# Patient Record
Sex: Female | Born: 1935 | Race: White | Hispanic: No | Marital: Married | Smoking: Former smoker
Health system: Southern US, Community
[De-identification: ages and names within clinical notes are randomized; demographics above are authoritative.]

## PROBLEM LIST (undated history)

## (undated) DIAGNOSIS — I10 Essential (primary) hypertension: Secondary | ICD-10-CM

## (undated) DIAGNOSIS — R319 Hematuria, unspecified: Secondary | ICD-10-CM

## (undated) DIAGNOSIS — M1990 Unspecified osteoarthritis, unspecified site: Secondary | ICD-10-CM

## (undated) DIAGNOSIS — M8580 Other specified disorders of bone density and structure, unspecified site: Secondary | ICD-10-CM

## (undated) DIAGNOSIS — I2510 Atherosclerotic heart disease of native coronary artery without angina pectoris: Secondary | ICD-10-CM

## (undated) DIAGNOSIS — I219 Acute myocardial infarction, unspecified: Secondary | ICD-10-CM

## (undated) DIAGNOSIS — E785 Hyperlipidemia, unspecified: Secondary | ICD-10-CM

## (undated) DIAGNOSIS — K219 Gastro-esophageal reflux disease without esophagitis: Secondary | ICD-10-CM

## (undated) HISTORY — DX: Essential (primary) hypertension: I10

## (undated) HISTORY — DX: Gastro-esophageal reflux disease without esophagitis: K21.9

## (undated) HISTORY — PX: NEPHROLITHOTOMY: SUR881

## (undated) HISTORY — DX: Unspecified osteoarthritis, unspecified site: M19.90

## (undated) HISTORY — PX: OTHER SURGICAL HISTORY: SHX169

## (undated) HISTORY — PX: CATARACT EXTRACTION W/ INTRAOCULAR LENS  IMPLANT, BILATERAL: SHX1307

## (undated) HISTORY — DX: Acute myocardial infarction, unspecified: I21.9

## (undated) HISTORY — DX: Hyperlipidemia, unspecified: E78.5

## (undated) HISTORY — DX: Atherosclerotic heart disease of native coronary artery without angina pectoris: I25.10

## (undated) HISTORY — PX: DILATION AND CURETTAGE OF UTERUS: SHX78

## (undated) HISTORY — DX: Hematuria, unspecified: R31.9

## (undated) HISTORY — DX: Other specified disorders of bone density and structure, unspecified site: M85.80

---

## 1998-09-17 ENCOUNTER — Other Ambulatory Visit: Admission: RE | Admit: 1998-09-17 | Discharge: 1998-09-17 | Payer: Self-pay | Admitting: Gynecology

## 1999-09-19 ENCOUNTER — Other Ambulatory Visit: Admission: RE | Admit: 1999-09-19 | Discharge: 1999-09-19 | Payer: Self-pay | Admitting: Obstetrics and Gynecology

## 2001-03-24 ENCOUNTER — Other Ambulatory Visit: Admission: RE | Admit: 2001-03-24 | Discharge: 2001-03-24 | Payer: Self-pay | Admitting: Obstetrics and Gynecology

## 2001-04-29 ENCOUNTER — Encounter: Payer: Self-pay | Admitting: Family Medicine

## 2001-04-29 ENCOUNTER — Encounter: Admission: RE | Admit: 2001-04-29 | Discharge: 2001-04-29 | Payer: Self-pay | Admitting: Family Medicine

## 2001-05-06 ENCOUNTER — Other Ambulatory Visit: Admission: RE | Admit: 2001-05-06 | Discharge: 2001-05-06 | Payer: Self-pay | Admitting: Family Medicine

## 2002-01-07 ENCOUNTER — Encounter: Admission: RE | Admit: 2002-01-07 | Discharge: 2002-01-07 | Payer: Self-pay | Admitting: Family Medicine

## 2002-01-07 ENCOUNTER — Encounter: Payer: Self-pay | Admitting: Family Medicine

## 2002-03-24 ENCOUNTER — Other Ambulatory Visit: Admission: RE | Admit: 2002-03-24 | Discharge: 2002-03-24 | Payer: Self-pay | Admitting: Obstetrics and Gynecology

## 2002-03-29 ENCOUNTER — Encounter: Payer: Self-pay | Admitting: Obstetrics and Gynecology

## 2002-03-29 ENCOUNTER — Encounter: Admission: RE | Admit: 2002-03-29 | Discharge: 2002-03-29 | Payer: Self-pay | Admitting: Obstetrics and Gynecology

## 2003-03-27 ENCOUNTER — Other Ambulatory Visit: Admission: RE | Admit: 2003-03-27 | Discharge: 2003-03-27 | Payer: Self-pay | Admitting: Obstetrics and Gynecology

## 2004-02-12 ENCOUNTER — Ambulatory Visit (HOSPITAL_COMMUNITY): Admission: RE | Admit: 2004-02-12 | Discharge: 2004-02-12 | Payer: Self-pay | Admitting: Gastroenterology

## 2004-02-12 ENCOUNTER — Encounter (INDEPENDENT_AMBULATORY_CARE_PROVIDER_SITE_OTHER): Payer: Self-pay | Admitting: Specialist

## 2004-04-01 ENCOUNTER — Other Ambulatory Visit: Admission: RE | Admit: 2004-04-01 | Discharge: 2004-04-01 | Payer: Self-pay | Admitting: Obstetrics and Gynecology

## 2005-04-01 ENCOUNTER — Other Ambulatory Visit: Admission: RE | Admit: 2005-04-01 | Discharge: 2005-04-01 | Payer: Self-pay | Admitting: Obstetrics and Gynecology

## 2005-11-04 ENCOUNTER — Encounter: Admission: RE | Admit: 2005-11-04 | Discharge: 2005-11-04 | Payer: Self-pay | Admitting: Family Medicine

## 2006-04-09 ENCOUNTER — Other Ambulatory Visit: Admission: RE | Admit: 2006-04-09 | Discharge: 2006-04-09 | Payer: Self-pay | Admitting: Obstetrics and Gynecology

## 2006-05-26 ENCOUNTER — Encounter: Admission: RE | Admit: 2006-05-26 | Discharge: 2006-05-26 | Payer: Self-pay | Admitting: Family Medicine

## 2006-06-30 ENCOUNTER — Ambulatory Visit (HOSPITAL_BASED_OUTPATIENT_CLINIC_OR_DEPARTMENT_OTHER): Admission: RE | Admit: 2006-06-30 | Discharge: 2006-06-30 | Payer: Self-pay | Admitting: Urology

## 2006-07-02 ENCOUNTER — Ambulatory Visit (HOSPITAL_COMMUNITY): Admission: RE | Admit: 2006-07-02 | Discharge: 2006-07-02 | Payer: Self-pay | Admitting: Urology

## 2007-04-13 ENCOUNTER — Other Ambulatory Visit: Admission: RE | Admit: 2007-04-13 | Discharge: 2007-04-13 | Payer: Self-pay | Admitting: Obstetrics and Gynecology

## 2009-05-30 ENCOUNTER — Other Ambulatory Visit: Admission: RE | Admit: 2009-05-30 | Discharge: 2009-05-30 | Payer: Self-pay | Admitting: Obstetrics and Gynecology

## 2010-06-18 NOTE — Op Note (Signed)
NAME:  Jacqueline Flores, Jacqueline Flores             ACCOUNT NO.:  [id number]   MEDICAL RECORD NO.:  [id number]          PATIENT TYPE:  AMB   LOCATION:  DAY                          FACILITY:  [facility]   PHYSICIAN:  Boston Service, M.D.DATE OF BIRTH:  [birth year]   DATE OF PROCEDURE:  06/30/2006  DATE OF DISCHARGE:                               OPERATIVE REPORT   PREOPERATIVE DIAGNOSIS:  Right ureteropelvic junction stones, 10 mm and  6 mm.   POSTOPERATIVE DIAGNOSIS:  Right ureteropelvic junction stones, 10 mm and  6 mm.   PROCEDURES:  1. Cystoscopy.  2. Retrograde double-J stent placement, 6-French 24 cm.   SURGEON:  Boston Service, M.D.   ASSISTANT:  None.   ANESTHESIA:  General.   SPECIMENS:  None.   ESTIMATED BLOOD LOSS:  Minimal.   COMPLICATIONS:  None obvious.   INDICATIONS:  A 75 year old female with right flank pain with hematuria;  CT and cystoscopy consistent with that diagnosis.  There are 10 mm and 6  mm stones on the right.  The patient had smaller nonobstructive stones  on the left, as seen on CT.  Plans made for stent placement in  anticipation of ESWL.   DESCRIPTION OF PROCEDURE:  The patient was prepped and draped in the  dorsal lithotomy position, after institution of an adequate level of  general anesthesia.  A well-lubricated 21-French panendoscope was gently  inserted at the urethral meatus.  There were normal urethra and  sphincter, normal trigone and orifices.  Mild atrophic changes.   Retrograde catheter was selected, positioned at the right and left  ureteral orifice.  With gentle injection of contrast, the ureter, pelvis  and calyces were outlined on the left.  Tiny stones appeared to be  nonobstructive.  On the right side, the patient appeared to have a 10 mm  stone perched above the UPJ, and a 6 mm stone within the lower pole.   A floppy-tip guidewire was inserted, passed beyond the UPJ stone into  what appeared to be dilated upper pole calyces.  A  6-French 24 cm double-  J stent was selected, passed over the indwelling guidewire -- with  excellent pigtail formation on guidewire removal; both within the renal  pelvis and within the bladder.  Bladder was drained.  Cystoscope was  removed.  The patient was returned to recovery in satisfactory  condition.           ______________________________  Boston Service, M.D.     RH/MEDQ  D:  06/30/2006  T:  06/30/2006  Job:  [id number]   cc:   Donia Guiles, M.D.  Fax: [phone]

## 2010-06-21 NOTE — Op Note (Signed)
NAME:  Jacqueline Flores, Jacqueline Flores             ACCOUNT NO.:  [id number]   MEDICAL RECORD NO.:  [id number]          PATIENT TYPE:  AMB   LOCATION:  ENDO                         FACILITY:  [facility]   PHYSICIAN:  James L. Malon Kindle., M.D.DATE OF BIRTH:  Sep 23, 1935   DATE OF PROCEDURE:  02/12/2004  DATE OF DISCHARGE:                                 OPERATIVE REPORT   PROCEDURE:  Colonoscopy with biopsy.   SURGEON:   MEDICATIONS:  Fentanyl 60 mcg and Versed 7 mg IV.   INDICATIONS FOR PROCEDURE:  Colon cancer screening.   DESCRIPTION OF PROCEDURE:  The procedure had been explained to the patient  and consent obtained.  In the left lateral decubitus position, the Olympus  pediatric adjustable colonoscope was inserted and advanced.  Prep was quite  good using and some abdominal pressure and position changes we were able to  reach the cecum.  There was an AVM in the cecum.  It was not active with  bleeding.  It was photographed.  The ileocecal valve and appendiceal orifice  were seen.  The scope was withdrawn and in the mid ascending colon, a 3 mm  sessile polyp was encountered. This was biopsied with cold biopsy forceps.  No other polyps were seen throughout the ascending, transverse or sigmoid  colon.  There was no significant diverticular disease.  Scope was withdrawn.  The patient tolerated the procedure well.   ASSESSMENT:  1.  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  2.  Ascending colon polyp biopsied, 211.3.   PLAN:  Will check pathology, routine post colonoscopy instructions.       JLE/MEDQ  D:  02/12/2004  T:  02/12/2004  Job:  [id number]   cc:   Donia Guiles, M.D.  [address] [place]  [place] [address]  Fax: [phone]

## 2010-11-29 DIAGNOSIS — H02839 Dermatochalasis of unspecified eye, unspecified eyelid: Secondary | ICD-10-CM | POA: Insufficient documentation

## 2011-07-16 ENCOUNTER — Other Ambulatory Visit (HOSPITAL_COMMUNITY)
Admission: RE | Admit: 2011-07-16 | Discharge: 2011-07-16 | Disposition: A | Discharge status: Home / Self Care | Payer: Medicare Other | Source: Ambulatory Visit | Attending: Family Medicine | Admitting: Family Medicine

## 2011-07-16 ENCOUNTER — Other Ambulatory Visit: Payer: Self-pay | Admitting: Family Medicine

## 2011-07-16 DIAGNOSIS — Z124 Encounter for screening for malignant neoplasm of cervix: Secondary | ICD-10-CM | POA: Insufficient documentation

## 2011-08-18 ENCOUNTER — Ambulatory Visit
Admission: RE | Admit: 2011-08-18 | Discharge: 2011-08-18 | Disposition: A | Discharge status: Home / Self Care | Payer: Medicare Other | Source: Ambulatory Visit | Attending: Family Medicine | Admitting: Family Medicine

## 2011-08-18 ENCOUNTER — Other Ambulatory Visit: Payer: Self-pay | Admitting: Family Medicine

## 2011-08-18 DIAGNOSIS — R937 Abnormal findings on diagnostic imaging of other parts of musculoskeletal system: Secondary | ICD-10-CM

## 2011-08-29 DIAGNOSIS — H00019 Hordeolum externum unspecified eye, unspecified eyelid: Secondary | ICD-10-CM | POA: Insufficient documentation

## 2011-12-01 DIAGNOSIS — Z961 Presence of intraocular lens: Secondary | ICD-10-CM | POA: Insufficient documentation

## 2012-04-23 ENCOUNTER — Other Ambulatory Visit: Payer: Self-pay | Admitting: Dermatology

## 2013-01-17 DIAGNOSIS — H0230 Blepharochalasis unspecified eye, unspecified eyelid: Secondary | ICD-10-CM | POA: Insufficient documentation

## 2013-03-10 ENCOUNTER — Encounter: Payer: Self-pay | Admitting: Cardiology

## 2013-03-10 DIAGNOSIS — K219 Gastro-esophageal reflux disease without esophagitis: Secondary | ICD-10-CM | POA: Insufficient documentation

## 2013-03-10 DIAGNOSIS — M899 Disorder of bone, unspecified: Secondary | ICD-10-CM | POA: Insufficient documentation

## 2013-03-10 DIAGNOSIS — E7800 Pure hypercholesterolemia, unspecified: Secondary | ICD-10-CM | POA: Insufficient documentation

## 2013-03-10 DIAGNOSIS — I2510 Atherosclerotic heart disease of native coronary artery without angina pectoris: Secondary | ICD-10-CM | POA: Insufficient documentation

## 2013-03-10 DIAGNOSIS — M949 Disorder of cartilage, unspecified: Secondary | ICD-10-CM

## 2013-03-10 DIAGNOSIS — I119 Hypertensive heart disease without heart failure: Secondary | ICD-10-CM | POA: Insufficient documentation

## 2013-03-10 DIAGNOSIS — I252 Old myocardial infarction: Secondary | ICD-10-CM | POA: Insufficient documentation

## 2013-10-21 ENCOUNTER — Encounter: Payer: Self-pay | Admitting: Neurology

## 2013-10-27 ENCOUNTER — Ambulatory Visit (INDEPENDENT_AMBULATORY_CARE_PROVIDER_SITE_OTHER): Payer: Medicare Other | Admitting: Neurology

## 2013-10-27 ENCOUNTER — Encounter: Payer: Self-pay | Admitting: Neurology

## 2013-10-27 VITALS — BP 118/76 | HR 65 | Ht 63.0 in | Wt 152.0 lb

## 2013-10-27 DIAGNOSIS — G20 Parkinson's disease: Secondary | ICD-10-CM

## 2013-10-27 DIAGNOSIS — R251 Tremor, unspecified: Secondary | ICD-10-CM

## 2013-10-27 DIAGNOSIS — G609 Hereditary and idiopathic neuropathy, unspecified: Secondary | ICD-10-CM

## 2013-10-27 DIAGNOSIS — R259 Unspecified abnormal involuntary movements: Secondary | ICD-10-CM

## 2013-10-27 LAB — CBC WITH DIFFERENTIAL/PLATELET
BASOS ABS: 0.1 10*3/uL (ref 0.0–0.1)
BASOS PCT: 1 % (ref 0–1)
EOS ABS: 0.2 10*3/uL (ref 0.0–0.7)
EOS PCT: 3 % (ref 0–5)
HEMATOCRIT: 44 % (ref 36.0–46.0)
Hemoglobin: 15 g/dL (ref 12.0–15.0)
Lymphocytes Relative: 32 % (ref 12–46)
Lymphs Abs: 2.4 10*3/uL (ref 0.7–4.0)
MCH: 32.2 pg (ref 26.0–34.0)
MCHC: 34.1 g/dL (ref 30.0–36.0)
MCV: 94.4 fL (ref 78.0–100.0)
MONO ABS: 0.8 10*3/uL (ref 0.1–1.0)
MONOS PCT: 11 % (ref 3–12)
NEUTROS ABS: 4 10*3/uL (ref 1.7–7.7)
Neutrophils Relative %: 53 % (ref 43–77)
Platelets: 245 10*3/uL (ref 150–400)
RBC: 4.66 MIL/uL (ref 3.87–5.11)
RDW: 13.9 % (ref 11.5–15.5)
WBC: 7.6 10*3/uL (ref 4.0–10.5)

## 2013-10-27 LAB — RPR

## 2013-10-27 LAB — TSH: TSH: 2.976 u[IU]/mL (ref 0.350–4.500)

## 2013-10-27 LAB — FOLATE: FOLATE: 17.3 ng/mL

## 2013-10-27 LAB — VITAMIN B12: Vitamin B-12: 199 pg/mL — ABNORMAL LOW (ref 211–911)

## 2013-10-27 NOTE — Progress Notes (Signed)
Subjective:   Jacqueline Flores was seen in consultation in the movement disorder clinic at the request of Dr. Laurann Montana.  Pts PCP is [name],[name], MD.  The evaluation is for tremor.  The patient is a 78 y.o. right handed female with a history of tremor.  Tremor just started in the R hand after a fall from a truck 3 weeks ago.  She was getting into her truck and literally fell out attempting to get in.  She fell on the L side of the body and L hip.  Did not hit the head, just the shoulder and ribs and buttocks;  A few days later, she noted tremor of the R hand (husband actually noted it first when it was at rest).  Not noted it when using it, but notes it when ambulating.    Tremor details: Affected by caffeine:  No.  (2-3 medium sized cups per day) Affected by alcohol: unknown Affected by stress:  No. Affected by fatigue:  Yes.   Spills soup if on spoon:  No. Spills glass of liquid if full:  No. Affects ADL's (tying shoes, brushing teeth, etc):  No.  Other sx's:  Voice: no changes Sleep: sleeps well, but cramps in legs  Vivid Dreams:  Yes.    Acting out dreams:  No. Wet Pillows: Yes.   Postural symptoms:  Yes.    Falls?  No. Bradykinesia symptoms: slow movements and difficulty getting out of a chair Loss of smell:  Yes.   Loss of taste:  No. Urinary Incontinence:  No. Difficulty Swallowing:  No. Handwriting, micrographia: No. But admits to "sloppy" Trouble with ADL's:  No.  Trouble buttoning clothing: Yes.  (has husband do this) Depression:  No. (nothing significant) Memory changes:  Mild loss (pay bills without trouble, drives and cooks without trouble, takes on meds without trouble) Hallucinations:  No.  visual distortions: Yes.   N/V:  No. Lightheaded:  Yes.    Syncope: No. Diplopia:  No.  Neuroimaging has never been performed   Outside reports reviewed: historical medical records, lab reports and office notes.  Allergies  Allergen Reactions  . Zetia  [Ezetimibe]     Swelling   . Sulfa Antibiotics Rash    Outpatient Encounter Prescriptions as of 10/27/2013  Medication Sig  . aspirin 81 MG tablet Take 81 mg by mouth daily.  . Calcium Carbonate (CALCIUM 600) 1500 MG TABS Take by mouth.  . hydrochlorothiazide (HYDRODIURIL) 25 MG tablet Take 25 mg by mouth as needed.  . moexipril (UNIVASC) 7.5 MG tablet Take 7.5 mg by mouth at bedtime.  Marland Kitchen NIFEdipine (PROCARDIA-XL/ADALAT CC) 30 MG 24 hr tablet Take 30 mg by mouth daily.  . Omega-3 Fatty Acids (FISH OIL) 1000 MG CAPS Take 1 capsule by mouth 3 (three) times daily.  Marland Kitchen omeprazole (PRILOSEC) 40 MG capsule Take 40 mg by mouth daily.  . pravastatin (PRAVACHOL) 80 MG tablet Take 80 mg by mouth daily.    Past Medical History  Diagnosis Date  . Coronary artery disease     post MI  . MI (myocardial infarction)     age 89  . Hyperlipidemia   . Hypertension   . Esophageal reflux   . Osteoarthritis   . Hematuria   . Osteopenia     Past Surgical History  Procedure Laterality Date  . Nephrolithotomy    . Dilation and curettage of uterus    . Cataract extraction w/ intraocular lens  implant, bilateral    . Lid  lift      bilaterally, lid lag    History   Social History  . Marital Status: Married    Spouse Name: N/A    Number of Children: N/A  . Years of Education: N/A   Occupational History  .      [employer]   Social History Main Topics  . Smoking status: Former Smoker -- 30 years    Quit date: 10/28/2010  . Smokeless tobacco: Not on file  . Alcohol Use: Yes     Comment: glass of wine once a month  . Drug Use: No  . Sexual Activity: Not on file   Other Topics Concern  . Not on file   Social History Narrative  . No narrative on file    Family Status  Relation Status Death Age  . Mother Deceased     Breast Cancer  . Father Deceased 46    MI, CAD  . Sister Alive     healthy  . Maternal Grandmother Deceased     DM  . Daughter Alive     healthy  .  Daughter Alive     healthy    Review of Systems A complete 10 system ROS was obtained and was negative apart from what is mentioned.   Objective:   VITALS:   Filed Vitals:   10/27/13 0828  BP: 118/76  Pulse: 65  Height: 5\' 3"  (1.6 m)  Weight: 152 lb (68.947 kg)   Gen:  Appears stated age and in NAD. HEENT:  Normocephalic, atraumatic. The mucous membranes are moist. The superficial temporal arteries are without ropiness or tenderness. Cardiovascular: Regular rate and rhythm. Lungs: Clear to auscultation bilaterally. Neck: There are no carotid bruits noted bilaterally.  NEUROLOGICAL:  Orientation:  The patient is alert and oriented x 3.  Recent and remote memory are intact.  Attention span and concentration are normal.  Able to name objects and repeat without trouble.  Fund of knowledge is appropriate.  However, is apraxic when asked to perform commands. Cranial nerves: There is good facial symmetry. The pupils are equal round and reactive to light bilaterally. Fundoscopic exam reveals clear disc margins bilaterally. Extraocular muscles are intact and visual fields are full to confrontational testing. Speech is fluent and clear. Soft palate rises symmetrically and there is no tongue deviation. Hearing is intact to conversational tone. Tone: Tone is good throughout.  No rigidity. Sensation: Sensation is intact to light touch and pinprick throughout (facial, trunk, extremities). Vibration is decreased markedly in distal fashion. There is no extinction with double simultaneous stimulation. There is no sensory dermatomal level identified. Coordination:  The patient has no decremation with rapid alternating movements and no dysmetria.  However, she does not perform rapid alternating movements all that well, but it appears to be more apraxia related. Motor: Strength is 5/5 in the bilateral upper and lower extremities.  Shoulder shrug is equal bilaterally.  There is no pronator drift.  There are  no fasciculations noted. DTR's: Deep tendon reflexes are 2/4 at the bilateral biceps, triceps, brachioradialis, patella and trace at the bilateral achilles.  Plantar responses are downgoing bilaterally. Gait and Station: The patient is able to ambulate without difficulty. There is good arm swing but there is right upper extremity tremor with ambulation  MOVEMENT EXAM: Tremor:  There is no postural tremor.  There is no rest tremor until I have her count backwards and name the months backwards and then a very mild/minimal rest tremor is noted.  Assessment/Plan:   1.  Tremor.  -There is just a very mild rest tremor that increases with ambulation, but she otherwise does not meet criteria for Parkinson's disease.  She is, however, somewhat apraxic with motor commands but she has no rigidity and very minimal bradykinesia.  -I am going to do an MRI of the brain, as she reports that this was relatively acute in onset after a fall.  However, she did not hit her head in the fall.  I explained to her that I absolutely expect to see small vessel disease as she has a history of hypertension, hyperlipidemia, coronary artery disease and tobacco abuse (and still smokes cigarette "now and then").  Talked about d/c all together.    -Talked about DaT scan.  Decided against it.  2.  PN, on examination  -Do labs to r/o reversible causes 3.  Decided to f/u in 4-6 months but explained that I absolutely want to see sooner if new neuro issues arise.

## 2013-10-27 NOTE — Patient Instructions (Signed)
1. Your provider has requested that you have labwork completed today. Please go to [facility] on the first floor of this building before leaving the office today. 2. We have scheduled you at [facility] for your MRI on 11/09/2013 at 8:00 am. Please arrive 15 minutes prior and go to 1st floor radiology. If you need to reschedule for any reason please call [phone].

## 2013-10-31 LAB — UIFE/LIGHT CHAINS/TP QN, 24-HR UR
ALBUMIN, U: DETECTED
ALPHA 1 UR: DETECTED — AB
Alpha 2, Urine: DETECTED — AB
Beta, Urine: DETECTED — AB
GAMMA UR: DETECTED — AB
TOTAL PROTEIN, URINE-UPE24: 12 mg/dL (ref 5–24)

## 2013-10-31 LAB — SPEP & IFE WITH QIG
ALBUMIN ELP: 57.6 % (ref 55.8–66.1)
ALPHA-1-GLOBULIN: 4.2 % (ref 2.9–4.9)
Alpha-2-Globulin: 11.7 % (ref 7.1–11.8)
Beta 2: 6 % (ref 3.2–6.5)
Beta Globulin: 6.4 % (ref 4.7–7.2)
Gamma Globulin: 14.1 % (ref 11.1–18.8)
IGA: 273 mg/dL (ref 69–380)
IGM, SERUM: 186 mg/dL (ref 52–322)
IgG (Immunoglobin G), Serum: 947 mg/dL (ref 690–1700)
TOTAL PROTEIN, SERUM ELECTROPHOR: 7.7 g/dL (ref 6.0–8.3)

## 2013-11-09 ENCOUNTER — Ambulatory Visit (HOSPITAL_COMMUNITY)
Admission: RE | Admit: 2013-11-09 | Discharge: 2013-11-09 | Disposition: A | Discharge status: Home / Self Care | Payer: Medicare Other | Source: Ambulatory Visit | Attending: Neurology | Admitting: Neurology

## 2013-11-09 DIAGNOSIS — Z720 Tobacco use: Secondary | ICD-10-CM | POA: Insufficient documentation

## 2013-11-09 DIAGNOSIS — R251 Tremor, unspecified: Secondary | ICD-10-CM | POA: Diagnosis present

## 2013-11-09 DIAGNOSIS — G319 Degenerative disease of nervous system, unspecified: Secondary | ICD-10-CM | POA: Diagnosis not present

## 2013-11-09 DIAGNOSIS — E785 Hyperlipidemia, unspecified: Secondary | ICD-10-CM | POA: Insufficient documentation

## 2013-11-09 DIAGNOSIS — E119 Type 2 diabetes mellitus without complications: Secondary | ICD-10-CM | POA: Insufficient documentation

## 2013-11-09 DIAGNOSIS — I10 Essential (primary) hypertension: Secondary | ICD-10-CM | POA: Diagnosis present

## 2013-11-09 DIAGNOSIS — G20 Parkinson's disease: Secondary | ICD-10-CM

## 2013-11-09 LAB — CREATININE, SERUM
Creatinine, Ser: 0.77 mg/dL (ref 0.50–1.10)
GFR, EST NON AFRICAN AMERICAN: 80 mL/min — AB (ref >90)

## 2013-11-09 MED ORDER — GADOBENATE DIMEGLUMINE 529 MG/ML IV SOLN
15.0000 mL | Freq: Once | INTRAVENOUS | Status: AC
  Administered 2013-11-09: 15 mL via INTRAVENOUS

## 2013-11-10 ENCOUNTER — Telehealth: Payer: Self-pay | Admitting: Neurology

## 2013-11-10 NOTE — Telephone Encounter (Signed)
Message copied by Annamaria Helling on Thu Nov 10, 2013  9:05 AM ------      Message from: TAT, [place] S      Created: Wed Nov 09, 2013  5:07 PM       Reviewed.  Very severe/significant small vessel disease and moderate/severe atrophy (cerebral more than cerebellar).  Jade, tell patient that MRI didn't show anything new or acute and nothing unexpected (these are the things that I told her that I thought we would see).  Brain has shrunk with age. ------

## 2013-11-10 NOTE — Telephone Encounter (Signed)
Tried to call patient with no answer and no option to leave voicemail. Will try again later.

## 2013-11-10 NOTE — Telephone Encounter (Signed)
Left message on machine for patient to call back.

## 2013-11-10 NOTE — Telephone Encounter (Signed)
Patient called back and made aware of results.

## 2013-11-11 ENCOUNTER — Telehealth: Payer: Self-pay | Admitting: Neurology

## 2013-11-11 NOTE — Telephone Encounter (Signed)
B12 injection scheduled for Tuesday.   Dr Tat - do these need to be monthly or start as weekly? Please advise.

## 2013-11-11 NOTE — Telephone Encounter (Signed)
Message copied by Annamaria Helling on Fri Nov 11, 2013 10:11 AM ------      Message from: TAT, [place]: Mon Oct 31, 2013  3:38 PM       Danae Chen, please call patient and let her know B12 low.  Recommend injections.  She can get here or at PCP office, whichever is easiest. ------

## 2013-11-14 NOTE — Telephone Encounter (Signed)
Given her low level, recommend weekly for a month and then q month

## 2013-11-15 ENCOUNTER — Ambulatory Visit (INDEPENDENT_AMBULATORY_CARE_PROVIDER_SITE_OTHER): Payer: Medicare Other | Admitting: Neurology

## 2013-11-15 DIAGNOSIS — E538 Deficiency of other specified B group vitamins: Secondary | ICD-10-CM

## 2013-11-15 MED ORDER — CYANOCOBALAMIN 1000 MCG/ML IJ SOLN
1000.0000 ug | Freq: Once | INTRAMUSCULAR | Status: AC
  Administered 2013-11-15: 1000 ug via INTRAMUSCULAR

## 2013-11-15 NOTE — Progress Notes (Signed)
B12 injection to left deltoid with no apparent complications.   

## 2013-11-16 ENCOUNTER — Telehealth: Payer: Self-pay | Admitting: Neurology

## 2013-11-16 NOTE — Telephone Encounter (Signed)
request for records recv'd by fax from [facility]. Request forwarded to HIM @ [facility] via courier for processing / Sherri

## 2013-11-22 ENCOUNTER — Ambulatory Visit (INDEPENDENT_AMBULATORY_CARE_PROVIDER_SITE_OTHER): Payer: Medicare Other | Admitting: Neurology

## 2013-11-22 DIAGNOSIS — E538 Deficiency of other specified B group vitamins: Secondary | ICD-10-CM

## 2013-11-22 MED ORDER — CYANOCOBALAMIN 1000 MCG/ML IJ SOLN
1000.0000 ug | Freq: Once | INTRAMUSCULAR | Status: AC
  Administered 2013-11-22: 1000 ug via INTRAMUSCULAR

## 2013-11-22 NOTE — Progress Notes (Signed)
B12 injection to right deltoid with no apparent complications.   

## 2013-11-23 DIAGNOSIS — H04123 Dry eye syndrome of bilateral lacrimal glands: Secondary | ICD-10-CM | POA: Insufficient documentation

## 2013-11-30 ENCOUNTER — Ambulatory Visit (INDEPENDENT_AMBULATORY_CARE_PROVIDER_SITE_OTHER): Payer: Medicare Other | Admitting: Neurology

## 2013-11-30 DIAGNOSIS — E538 Deficiency of other specified B group vitamins: Secondary | ICD-10-CM

## 2013-11-30 MED ORDER — CYANOCOBALAMIN 1000 MCG/ML IJ SOLN
1000.0000 ug | Freq: Once | INTRAMUSCULAR | Status: AC
  Administered 2013-11-30: 1000 ug via INTRAMUSCULAR

## 2013-11-30 NOTE — Progress Notes (Signed)
B12 injection to right deltoid with no apparent complications.   

## 2013-12-07 ENCOUNTER — Ambulatory Visit (INDEPENDENT_AMBULATORY_CARE_PROVIDER_SITE_OTHER): Payer: Medicare Other | Admitting: Neurology

## 2013-12-07 DIAGNOSIS — E538 Deficiency of other specified B group vitamins: Secondary | ICD-10-CM

## 2013-12-07 MED ORDER — CYANOCOBALAMIN 1000 MCG/ML IJ SOLN
1000.0000 ug | Freq: Once | INTRAMUSCULAR | Status: AC
  Administered 2013-12-07: 1000 ug via INTRAMUSCULAR

## 2013-12-07 NOTE — Progress Notes (Signed)
B12 injection to left deltoid with no apparent complications.   

## 2014-01-04 ENCOUNTER — Ambulatory Visit (INDEPENDENT_AMBULATORY_CARE_PROVIDER_SITE_OTHER): Payer: Medicare Other | Admitting: Neurology

## 2014-01-04 DIAGNOSIS — E538 Deficiency of other specified B group vitamins: Secondary | ICD-10-CM

## 2014-01-04 MED ORDER — CYANOCOBALAMIN 1000 MCG/ML IJ SOLN
1000.0000 ug | Freq: Once | INTRAMUSCULAR | Status: AC
  Administered 2014-01-04: 1000 ug via INTRAMUSCULAR

## 2014-01-04 NOTE — Progress Notes (Signed)
B12 injection to left deltoid with no apparent complications.   

## 2014-02-07 ENCOUNTER — Ambulatory Visit (INDEPENDENT_AMBULATORY_CARE_PROVIDER_SITE_OTHER): Payer: Medicare Other | Admitting: Neurology

## 2014-02-07 DIAGNOSIS — E538 Deficiency of other specified B group vitamins: Secondary | ICD-10-CM

## 2014-02-07 MED ORDER — CYANOCOBALAMIN 1000 MCG/ML IJ SOLN
1000.0000 ug | Freq: Once | INTRAMUSCULAR | Status: AC
  Administered 2014-02-07: 1000 ug via INTRAMUSCULAR

## 2014-02-07 NOTE — Progress Notes (Signed)
B12 injection to right deltoid with no apparent complications.   

## 2014-03-02 ENCOUNTER — Telehealth: Payer: Self-pay | Admitting: Neurology

## 2014-03-02 NOTE — Telephone Encounter (Signed)
Medical records request received from [place] at [facility] and forwarded to medical records.

## 2014-03-10 ENCOUNTER — Ambulatory Visit (INDEPENDENT_AMBULATORY_CARE_PROVIDER_SITE_OTHER): Payer: Medicare Other | Admitting: *Deleted

## 2014-03-10 DIAGNOSIS — E538 Deficiency of other specified B group vitamins: Secondary | ICD-10-CM

## 2014-03-10 MED ORDER — CYANOCOBALAMIN 1000 MCG/ML IJ SOLN
1000.0000 ug | Freq: Once | INTRAMUSCULAR | Status: AC
Start: 2014-03-10 — End: 2014-03-10
  Administered 2014-03-10: 1000 ug via INTRAMUSCULAR

## 2014-03-10 NOTE — Progress Notes (Signed)
Patient in for B12 injection. 

## 2014-04-04 ENCOUNTER — Ambulatory Visit (INDEPENDENT_AMBULATORY_CARE_PROVIDER_SITE_OTHER): Payer: Medicare Other | Admitting: Neurology

## 2014-04-04 ENCOUNTER — Encounter: Payer: Self-pay | Admitting: Neurology

## 2014-04-04 VITALS — BP 90/58 | HR 76 | Ht 63.0 in | Wt 160.0 lb

## 2014-04-04 DIAGNOSIS — E538 Deficiency of other specified B group vitamins: Secondary | ICD-10-CM

## 2014-04-04 DIAGNOSIS — G609 Hereditary and idiopathic neuropathy, unspecified: Secondary | ICD-10-CM

## 2014-04-04 DIAGNOSIS — I6782 Cerebral ischemia: Secondary | ICD-10-CM

## 2014-04-04 DIAGNOSIS — F419 Anxiety disorder, unspecified: Secondary | ICD-10-CM

## 2014-04-04 MED ORDER — CYANOCOBALAMIN 1000 MCG/ML IJ SOLN
1000.0000 ug | Freq: Once | INTRAMUSCULAR | Status: AC
  Administered 2014-04-04: 1000 ug via INTRAMUSCULAR

## 2014-04-04 NOTE — Progress Notes (Signed)
B12 injection to left deltoid with no apparent complications.   

## 2014-04-04 NOTE — Progress Notes (Signed)
Subjective:   Jacqueline Flores was seen in consultation in the movement disorder clinic at the request of Dr. Laurann Montana.  Pts PCP is [name],[name], MD.  The evaluation is for tremor.  The patient is a 79 y.o. right handed female with a history of tremor.  Tremor just started in the R hand after a fall from a truck 3 weeks ago.  She was getting into her truck and literally fell out attempting to get in.  She fell on the L side of the body and L hip.  Did not hit the head, just the shoulder and ribs and buttocks;  A few days later, she noted tremor of the R hand (husband actually noted it first when it was at rest).  Not noted it when using it, but notes it when ambulating.    Tremor details: Affected by caffeine:  No.  (2-3 medium sized cups per day) Affected by alcohol: unknown Affected by stress:  No. Affected by fatigue:  Yes.   Spills soup if on spoon:  No. Spills glass of liquid if full:  No. Affects ADL's (tying shoes, brushing teeth, etc):  No.  04/04/14 update:  The patient is following up today, earlier than expected.  She is accompanied by her daughter who supplements the history.  She thinks that tremor has increased on the right.  She did have an MRI of the brain since last visit that revealed  atrophy and relatively severe small vessel disease.  She is already on ASA.  She had lab workup since last visit for reversible causes of peripheral neuropathy.  Her B12 was quite low at 199.  She has been started on injections and has been receiving them faithfully.  Her RPR was negative, TSH was normal, and there was no M spike in the serum protein electrophoresis.  There is no monoclonal proteins and urine protein electrophoresis.    She is c/o dry eye and has been to the eye doctor.  She is using drops and a gel in her eyes.  No diplopia.  She has had no falls since last visit, but she has "stumbled some."  Daughter states that she hears the patient "shuffle more."  Daughter states that the  biggest issue is her anxiety/depression as she will cry when she has days of tremor.  She and her daughter talk about her "parkinsons."   Outside reports reviewed: historical medical records, lab reports and office notes.  Allergies  Allergen Reactions  . Zetia [Ezetimibe]     Swelling   . Sulfa Antibiotics Rash    Outpatient Encounter Prescriptions as of 04/04/2014  Medication Sig  . aspirin 81 MG tablet Take 81 mg by mouth daily.  . Calcium Carbonate (CALCIUM 600) 1500 MG TABS Take by mouth.  . hydrochlorothiazide (HYDRODIURIL) 25 MG tablet Take 25 mg by mouth as needed.  . moexipril (UNIVASC) 7.5 MG tablet Take 7.5 mg by mouth at bedtime.  Marland Kitchen NIFEdipine (PROCARDIA-XL/ADALAT CC) 30 MG 24 hr tablet Take 30 mg by mouth daily.  . Omega-3 Fatty Acids (FISH OIL) 1000 MG CAPS Take 1 capsule by mouth 3 (three) times daily.  Marland Kitchen omeprazole (PRILOSEC) 40 MG capsule Take 40 mg by mouth daily.  . pravastatin (PRAVACHOL) 80 MG tablet Take 80 mg by mouth daily.    Past Medical History  Diagnosis Date  . Coronary artery disease     post MI  . MI (myocardial infarction)     age 47  . Hyperlipidemia   .  Hypertension   . Esophageal reflux   . Osteoarthritis   . Hematuria   . Osteopenia     Past Surgical History  Procedure Laterality Date  . Nephrolithotomy    . Dilation and curettage of uterus    . Cataract extraction w/ intraocular lens  implant, bilateral    . Lid lift      bilaterally, lid lag    History   Social History  . Marital Status: Married    Spouse Name: N/A  . Number of Children: N/A  . Years of Education: N/A   Occupational History  .      [employer]   Social History Main Topics  . Smoking status: Former Smoker -- 30 years    Quit date: 10/28/2010  . Smokeless tobacco: Not on file  . Alcohol Use: Yes     Comment: glass of wine once a month  . Drug Use: No  . Sexual Activity: Not on file   Other Topics Concern  . Not on file   Social History  Narrative    Family Status  Relation Status Death Age  . Mother Deceased     Breast Cancer  . Father Deceased 18    MI, CAD  . Sister Alive     healthy  . Maternal Grandmother Deceased     DM  . Daughter Alive     healthy  . Daughter Alive     healthy    Review of Systems Admits to some neck pain.  A complete 10 system ROS was obtained and was negative apart from what is mentioned.   Objective:   VITALS:   Filed Vitals:   04/04/14 1336  BP: 90/58  Pulse: 76  Height: 5\' 3"  (1.6 m)  Weight: 160 lb (72.576 kg)   Gen:  Appears stated age and in NAD. HEENT:  Normocephalic, atraumatic. The mucous membranes are moist. The superficial temporal arteries are without ropiness or tenderness. Cardiovascular: Regular rate and rhythm. Lungs: Clear to auscultation bilaterally. Neck: There are no carotid bruits noted bilaterally.  NEUROLOGICAL:  Orientation:  The patient is alert and oriented x 3.  Recent and remote memory are intact.  Attention span and concentration are normal.  Able to name objects and repeat without trouble.  Fund of knowledge is appropriate.  However, is apraxic when asked to perform commands. Cranial nerves: There is good facial symmetry. The pupils are equal round and reactive to light bilaterally.  Speech is fluent and clear. Soft palate rises symmetrically and there is no tongue deviation. Hearing is intact to conversational tone. Tone: Tone is good throughout.  No rigidity. Sensation: Sensation is intact to light touch and pinprick throughout (facial, trunk, extremities). Vibration is decreased markedly in distal fashion. There is no extinction with double simultaneous stimulation. There is no sensory dermatomal level identified. Coordination:  The patient has no decremation with rapid alternating movements and no dysmetria.  However, she does not perform rapid alternating movements all that well, but it appears to be more apraxia related. Motor: Strength is 5/5  in the bilateral upper and lower extremities.  Shoulder shrug is equal bilaterally.  There is no pronator drift.  There are no fasciculations noted. Gait and Station: The patient is able to ambulate without difficulty. There is good arm swing but there is right upper extremity tremor with ambulation.  There is a positive pull test.    MOVEMENT EXAM: Tremor:  There is no postural tremor.  There is no  rest tremor with distraction procedures.       Assessment/Plan:   1.  Tremor.  -There is just a very mild rest tremor that increases with ambulation, but she otherwise does not meet criteria for Parkinson's disease.  She is, however, somewhat apraxic with motor commands but she has no rigidity and very minimal bradykinesia.  I discussed this again with the patient, with her daughter present today and told her that I cannot be sure that she won't have PD in the future, but that she didn't have it now.  Discussed again utility of DaT scan but doesn't wish to proceed.    2.  Cerebral small vessel disease  -Relatively significant on her MRI of the brain.  She has a history of hypertension, hyperlipidemia, coronary artery disease and tobacco abuse (and still smokes cigarette "now and then").  Talked about d/c all together.  She will remain on aspirin.  Talked to her about risk factors associated with this degree of small vessel disease.  3.  PN, on examination with identified B12 deficiency  -Wilson to physical therapy for balance therapy, at her daughter's request.  Discussed the importance of safe, cardiovascular exercise.  -explained to the patient that this can contribute to gait instability.    -We'll continue B12 injections and get a trough level next time. 4.  Anxiety and depression   -No suicidal or homicidal ideation.  She refuses medication for this.    5.  f/u in 6 months but explained that I absolutely want to see sooner if new neuro issues arise.

## 2014-04-06 ENCOUNTER — Ambulatory Visit: Payer: Medicare Other

## 2014-04-19 ENCOUNTER — Ambulatory Visit: Payer: Medicare Other | Attending: Neurology | Admitting: Physical Therapy

## 2014-04-19 DIAGNOSIS — R2681 Unsteadiness on feet: Secondary | ICD-10-CM | POA: Insufficient documentation

## 2014-04-19 NOTE — Therapy (Signed)
[facility] [place], [place], [address] Phone: [phone]   Fax:  [phone]  Physical Therapy Evaluation  Patient Details  Name: Jacqueline Flores MRN: [id number] Date of Birth: 1935/07/06 Referring Provider:  Ludwig Clarks, DO  Encounter Date: 04/19/2014      PT End of Session - 04/19/14 1001    Visit Number 1   Number of Visits 12   Date for PT Re-Evaluation 05/31/14   PT Start Time 0950   PT Stop Time 1031   PT Time Calculation (min) 41 min   Activity Tolerance Patient tolerated treatment well      Past Medical History  Diagnosis Date  . Coronary artery disease     post MI  . MI (myocardial infarction)     age 58  . Hyperlipidemia   . Hypertension   . Esophageal reflux   . Osteoarthritis   . Hematuria   . Osteopenia     Past Surgical History  Procedure Laterality Date  . Nephrolithotomy    . Dilation and curettage of uterus    . Cataract extraction w/ intraocular lens  implant, bilateral    . Lid lift      bilaterally, lid lag    There were no vitals filed for this visit.  Visit Diagnosis:  Gait instability - Plan: PT plan of care cert/re-cert      Subjective Assessment - 04/19/14 1033    Symptoms Want to walk better.   Limitations Walking   Patient Stated Goals Not fall.   Multiple Pain Sites No            [facility] PT Assessment - 04/19/14 0001    Assessment   Medical Diagnosis Peripheral neuropathy   Onset Date --  8 months.   Precautions   Precautions --  Fall risk.  Please be with patient at all times.   Balance Screen   Has the patient fallen in the past 6 months Yes   How many times? 3   Has the patient had a decrease in activity level because of a fear of falling?  Yes   Is the patient reluctant to leave their home because of a fear of falling?  No   Berg Balance Test   Sit to Stand Able to stand  independently using hands   Standing Unsupported Able to stand safely 2  minutes   Sitting with Back Unsupported but Feet Supported on Floor or Stool Able to sit safely and securely 2 minutes   Stand to Sit Sits safely with minimal use of hands   Transfers Able to transfer safely, minor use of hands   Standing Unsupported with Eyes Closed Able to stand 10 seconds safely   Standing Ubsupported with Feet Together Able to place feet together independently and stand 1 minute safely   From Standing, Reach Forward with Outstretched Arm Can reach confidently >25 cm (10")   From Standing Position, Pick up Object from Floor Able to pick up shoe, needs supervision   From Standing Position, Turn to Look Behind Over each Shoulder Looks behind from both sides and weight shifts well   Turn 360 Degrees Able to turn 360 degrees safely in 4 seconds or less   Standing Unsupported, Alternately Place Feet on Step/Stool Able to complete 4 steps without aid or supervision   Standing Unsupported, One Foot in Front Able to plae foot ahead of the other independently and hold 30 seconds   Standing on One  Leg Able to lift leg independently and hold equal to or more than 3 seconds   Total Score 49     Normal LE ROM and strength.  Normal bilateral LE DTR's.  Gait cycle is generally WNL.                                  Plan - 11-May-2014 1035    PT Next Visit Plan Stationary bike; Balance program; general LE strengthening.   Consulted and Agree with Plan of Care Patient          G-Codes - 11-May-2014 1234    Functional Assessment Tool Used Clinical judgement.   Functional Limitation Mobility: Walking and moving around   Mobility: Walking and Moving Around Current Status [phone]) At least 20 percent but less than 40 percent impaired, limited or restricted   Mobility: Walking and Moving Around Goal Status [phone]) At least 1 percent but less than 20 percent impaired, limited or restricted       Problem List Patient Active Problem List   Diagnosis Date Noted   . Pure hypercholesterolemia 03/10/2013  . Coronary atherosclerosis of unspecified type of vessel, native or graft 03/10/2013  . Esophageal reflux 03/10/2013  . Benign hypertensive heart disease without heart failure 03/10/2013  . Old myocardial infarction 03/10/2013  . Disorder of bone and cartilage, unspecified 03/10/2013    Jacqueline Flores, [place] MPT 2014/05/11, 12:40 PM  [facility] [address] [place], [place], [address] Phone: [phone]   Fax:  [phone]

## 2014-04-19 NOTE — Patient Instructions (Signed)
Recommended patient use cane at all times.

## 2014-04-20 LAB — VITAMIN B12: VITAMIN B 12: 473 pg/mL (ref 211–911)

## 2014-04-24 ENCOUNTER — Encounter: Payer: Self-pay | Admitting: Physical Therapy

## 2014-04-24 ENCOUNTER — Ambulatory Visit: Payer: Medicare Other | Admitting: Physical Therapy

## 2014-04-24 DIAGNOSIS — R2681 Unsteadiness on feet: Secondary | ICD-10-CM

## 2014-04-24 NOTE — Therapy (Signed)
[facility] [place], [place], [address] Phone: [phone]   Fax:  [phone]  Physical Therapy Treatment  Patient Details  Name: Jacqueline Flores MRN: [id number] Date of Birth: 1935/08/21 Referring Provider:  Mayra Neer, MD  Encounter Date: 04/24/2014      PT End of Session - 04/24/14 8299    Visit Number 2   Number of Visits 12   Date for PT Re-Evaluation 05/31/14   PT Start Time 0817   PT Stop Time 0858   PT Time Calculation (min) 41 min   Activity Tolerance Patient tolerated treatment well   Behavior During Therapy [facility] for tasks assessed/performed      Past Medical History  Diagnosis Date  . Coronary artery disease     post MI  . MI (myocardial infarction)     age 79  . Hyperlipidemia   . Hypertension   . Esophageal reflux   . Osteoarthritis   . Hematuria   . Osteopenia     Past Surgical History  Procedure Laterality Date  . Nephrolithotomy    . Dilation and curettage of uterus    . Cataract extraction w/ intraocular lens  implant, bilateral    . Lid lift      bilaterally, lid lag    There were no vitals filed for this visit.  Visit Diagnosis:  Gait instability      Subjective Assessment - 04/24/14 0820    Symptoms Patient reports that she did not ambulate much this weekend but she was "good." Patient states that she stumbles and does not pick up her feet. Also has issues with ankle strength. Patient reports that she has been diagnosed with Parkinson's Disease.    Limitations Walking   Currently in Pain? No/denies                       [facility] Adult PT Treatment/Exercise - 04/24/14 0001    Balance   Balance Assessed No   Exercises   Exercises Knee/Hip   Knee/Hip Exercises: Aerobic   Stationary Bike Seat L2, x 8 minutes   Knee/Hip Exercises: Standing   Forward Step Up Both;3 sets;10 reps;Hand Hold: 2;Step Height: 6"   Rocker Board 3 minutes  For ankle proprioception    Other Standing Knee Exercises Bosu x 3 min eyes open 2 hand hold, x 3 min eyes closed 2 finger hold   Other Standing Knee Exercises Airex marching x 3 minutes 2 hand hold   Ankle Exercises: Aerobic   Stationary Bike Seat L2, 8 minutes   Ankle Exercises: Standing   Rocker Board --   Ankle Exercises: Seated   Other Seated Ankle Exercises DF/PF 3 sets 10 reps; Inv/Ev 3 sets 10 reps both LE  on dynadisc                PT Education - 04/24/14 0900    Education provided Yes   Education Details Patient educated that after sit to stand to stand to make sure of balance before ambulating   Person(s) Educated Patient   Methods Explanation   Comprehension Verbalized understanding;Need further instruction             PT Long Term Goals - 04/24/14 3716    PT LONG TERM GOAL #1   Title Ind with HEP   Time 4   Period Weeks   Status On-going   PT LONG TERM GOAL #2   Title Increase Berg score to 52/56.  Time 4   Period Weeks   Status On-going               Plan - 04/24/14 0901    Clinical Impression Statement Patient tolerated treatment well during today's session and welcomed exercises. Patient expresses concern over tremors that worsen when stressed. Patient denied pain at the end of the session and stated that her legs felt "okay" following treatment.  Required multimodal cueing for technique of the exercises. PTA was with patient at all times and guarding during standing exercises.   Rehab Potential Good   PT Frequency 2x / week   PT Duration 6 weeks   PT Next Visit Plan Continue LE strengthening and balance exercises per PT POC. Incorporate UE reaching with LE balance exercises for distraction purposes as patient progresses.   Consulted and Agree with Plan of Care Patient        Problem List Patient Active Problem List   Diagnosis Date Noted  . Pure hypercholesterolemia 03/10/2013  . Coronary atherosclerosis of unspecified type of vessel, native or graft  03/10/2013  . Esophageal reflux 03/10/2013  . Benign hypertensive heart disease without heart failure 03/10/2013  . Old myocardial infarction 03/10/2013  . Disorder of bone and cartilage, unspecified 03/10/2013    Wynelle Fanny, PTA 04/24/2014, 9:20 AM  [facility] [address] [place], [place], [address] Phone: [phone]   Fax:  [phone]

## 2014-04-25 ENCOUNTER — Telehealth: Payer: Self-pay | Admitting: Neurology

## 2014-04-25 NOTE — Telephone Encounter (Signed)
Pt wants to know the b-12 results please call [phone]

## 2014-04-25 NOTE — Telephone Encounter (Signed)
Patient made aware b12 level good. Will continue injections.

## 2014-04-26 ENCOUNTER — Ambulatory Visit: Payer: Medicare Other | Admitting: Physical Therapy

## 2014-04-26 ENCOUNTER — Encounter: Payer: Self-pay | Admitting: Physical Therapy

## 2014-04-26 DIAGNOSIS — R2681 Unsteadiness on feet: Secondary | ICD-10-CM | POA: Diagnosis not present

## 2014-04-26 NOTE — Therapy (Signed)
[facility] [place], [place], [address] Phone: [phone]   Fax:  [phone]  Physical Therapy Treatment  Patient Details  Name: Jacqueline Flores MRN: [id number] Date of Birth: 11/07/1935 Referring Provider:  Mayra Neer, MD  Encounter Date: 04/26/2014      PT End of Session - 04/26/14 0901    Visit Number 3   Number of Visits 12   Date for PT Re-Evaluation 05/31/14   PT Start Time 0818   PT Stop Time 0900   PT Time Calculation (min) 42 min   Activity Tolerance Patient tolerated treatment well   Behavior During Therapy [facility] for tasks assessed/performed      Past Medical History  Diagnosis Date  . Coronary artery disease     post MI  . MI (myocardial infarction)     age 37  . Hyperlipidemia   . Hypertension   . Esophageal reflux   . Osteoarthritis   . Hematuria   . Osteopenia     Past Surgical History  Procedure Laterality Date  . Nephrolithotomy    . Dilation and curettage of uterus    . Cataract extraction w/ intraocular lens  implant, bilateral    . Lid lift      bilaterally, lid lag    There were no vitals filed for this visit.  Visit Diagnosis:  Gait instability      Subjective Assessment - 04/26/14 0836    Symptoms pt did good after last tx, having difficulty with walking and stooping down to reach for thinngs or yard work   Limitations Walking   Patient Stated Goals Not fall.   Currently in Pain? No/denies                       [facility] Adult PT Treatment/Exercise - 04/26/14 0001    Dynamic Standing Balance   Dynamic Standing - Balance Activities Rocker board  x3 min   Dynamic Standing - Comments Balance on airex feet apart/together reaching out of BOS/2#reachouts 2x10  cone reaching each UE then cone for toe taps in circle pattern   High Level Balance   High Level Balance Activities Side stepping;Backward walking   High Level Balance Comments 2x10 each   Knee/Hip  Exercises: Aerobic   Stationary Bike nustep L4 x 15 min UE/LE    Knee/Hip Exercises: Standing   Forward Step Up Right;3 sets;10 reps                     PT Long Term Goals - 04/24/14 0737    PT LONG TERM GOAL #1   Title Ind with HEP   Time 4   Period Weeks   Status On-going   PT LONG TERM GOAL #2   Title Increase Berg score to 52/56.   Time 4   Period Weeks   Status On-going               Plan - 04/26/14 0904    Clinical Impression Statement pt tolerated tx very well, able to follow balance activities and had more difficulty with LE BOS vs UE today.goals ongoing.   Pt will benefit from skilled therapeutic intervention in order to improve on the following deficits Abnormal gait;Decreased balance   Rehab Potential Good   PT Frequency 2x / week   PT Duration 6 weeks   PT Next Visit Plan Continue LE strengthening and balance exercises per PT POC.  LE balance with cones  and side step and backward stepping   Consulted and Agree with Plan of Care Patient        Problem List Patient Active Problem List   Diagnosis Date Noted  . Pure hypercholesterolemia 03/10/2013  . Coronary atherosclerosis of unspecified type of vessel, native or graft 03/10/2013  . Esophageal reflux 03/10/2013  . Benign hypertensive heart disease without heart failure 03/10/2013  . Old myocardial infarction 03/10/2013  . Disorder of bone and cartilage, unspecified 03/10/2013    Tanisia Yokley P, PTA 04/26/2014, 9:09 AM  [facility] [address] [place], [place], [address] Phone: [phone]   Fax:  [phone]

## 2014-04-27 ENCOUNTER — Ambulatory Visit: Payer: Medicare Other | Admitting: Neurology

## 2014-05-01 ENCOUNTER — Ambulatory Visit: Payer: Medicare Other | Admitting: Physical Therapy

## 2014-05-01 DIAGNOSIS — R2681 Unsteadiness on feet: Secondary | ICD-10-CM | POA: Diagnosis not present

## 2014-05-01 NOTE — Therapy (Signed)
[facility] [place], [place], [address] Phone: [phone]   Fax:  [phone]  Physical Therapy Treatment  Patient Details  Name: Jacqueline Flores MRN: [id number] Date of Birth: [birth year] Referring Provider:  Mayra Neer, MD  Encounter Date: 05/11/14      PT End of Session - 05-11-2014 0921    Visit Number 4   Number of Visits 12   PT Start Time 0907   PT Stop Time 0943   PT Time Calculation (min) 36 min      Past Medical History  Diagnosis Date  . Coronary artery disease     post MI  . MI (myocardial infarction)     age 79  . Hyperlipidemia   . Hypertension   . Esophageal reflux   . Osteoarthritis   . Hematuria   . Osteopenia     Past Surgical History  Procedure Laterality Date  . Nephrolithotomy    . Dilation and curettage of uterus    . Cataract extraction w/ intraocular lens  implant, bilateral    . Lid lift      bilaterally, lid lag    There were no vitals filed for this visit.  Visit Diagnosis:  Gait instability      Subjective Assessment - 2014/05/11 0930    Symptoms Doing good today.                                    PT Long Term Goals - 05-11-2014 0948    PT LONG TERM GOAL #1   Status Achieved   PT LONG TERM GOAL #2   Status Partially Met    Treatment: Ther Ex:  Nustep level 5 x 15 minutes  Neuro Re-education:  Rockerboard x 5 minutes.  Inverted Bosu ball:  12 minutes.     PHYSICAL THERAPY DISCHARGE SUMMARY  Visits from Start of Care: 4  Current functional level related to goals / functional outcomes:  Please see above.   Remaining deficits:  Balance improved.   Education / Equipment:  Patient pleased with progress and plans to join the self-directed gym program now. Plan: Patient agrees to discharge.  Patient goals were partially met. Patient is being discharged due to being pleased with the current functional level.  ?????               G-Codes - 05/11/2014 0945    Functional Assessment Tool Used Clinical judgement.   Functional Limitation Mobility: Walking and moving around   Mobility: Walking and Moving Around Current Status [phone]) At least 1 percent but less than 20 percent impaired, limited or restricted   Mobility: Walking and Moving Around Goal Status [phone]) At least 1 percent but less than 20 percent impaired, limited or restricted   Mobility: Walking and Moving Around Discharge Status [phone]) At least 1 percent but less than 20 percent impaired, limited or restricted      Problem List Patient Active Problem List   Diagnosis Date Noted  . Pure hypercholesterolemia 03/10/2013  . Coronary atherosclerosis of unspecified type of vessel, native or graft 03/10/2013  . Esophageal reflux 03/10/2013  . Benign hypertensive heart disease without heart failure 03/10/2013  . Old myocardial infarction 03/10/2013  . Disorder of bone and cartilage, unspecified 03/10/2013    Ramon Zanders, [place] MPT 2014/05/11, 9:48 AM  [facility] [address] [place], [place], [address] Phone: [phone]  Fax:  [phone]

## 2014-05-08 ENCOUNTER — Ambulatory Visit: Payer: Medicare Other

## 2014-05-11 ENCOUNTER — Ambulatory Visit (INDEPENDENT_AMBULATORY_CARE_PROVIDER_SITE_OTHER): Payer: Medicare Other | Admitting: Neurology

## 2014-05-11 DIAGNOSIS — E538 Deficiency of other specified B group vitamins: Secondary | ICD-10-CM

## 2014-05-11 MED ORDER — CYANOCOBALAMIN 1000 MCG/ML IJ SOLN
1000.0000 ug | Freq: Once | INTRAMUSCULAR | Status: AC
  Administered 2014-05-11: 1000 ug via INTRAMUSCULAR

## 2014-05-11 NOTE — Progress Notes (Signed)
B12 injection to right deltoid with no apparent complications.   

## 2014-06-13 ENCOUNTER — Ambulatory Visit (INDEPENDENT_AMBULATORY_CARE_PROVIDER_SITE_OTHER): Payer: Medicare Other | Admitting: Neurology

## 2014-06-13 DIAGNOSIS — E538 Deficiency of other specified B group vitamins: Secondary | ICD-10-CM | POA: Diagnosis not present

## 2014-06-13 MED ORDER — CYANOCOBALAMIN 1000 MCG/ML IJ SOLN
1000.0000 ug | Freq: Once | INTRAMUSCULAR | Status: AC
  Administered 2014-06-13: 1000 ug via INTRAMUSCULAR

## 2014-06-13 NOTE — Progress Notes (Signed)
B12 injection to left deltoid with no apparent complications.   

## 2014-07-11 ENCOUNTER — Ambulatory Visit (INDEPENDENT_AMBULATORY_CARE_PROVIDER_SITE_OTHER): Payer: Medicare Other | Admitting: Neurology

## 2014-07-11 DIAGNOSIS — E538 Deficiency of other specified B group vitamins: Secondary | ICD-10-CM | POA: Diagnosis not present

## 2014-07-11 MED ORDER — CYANOCOBALAMIN 1000 MCG/ML IJ SOLN
1000.0000 ug | Freq: Once | INTRAMUSCULAR | Status: AC
  Administered 2014-07-11: 1000 ug via INTRAMUSCULAR

## 2014-07-11 NOTE — Progress Notes (Signed)
B12 injection to right deltoid with no apparent complications.   

## 2014-08-08 ENCOUNTER — Ambulatory Visit (INDEPENDENT_AMBULATORY_CARE_PROVIDER_SITE_OTHER): Payer: Medicare Other | Admitting: Neurology

## 2014-08-08 DIAGNOSIS — E538 Deficiency of other specified B group vitamins: Secondary | ICD-10-CM

## 2014-08-08 MED ORDER — CYANOCOBALAMIN 1000 MCG/ML IJ SOLN
1000.0000 ug | Freq: Once | INTRAMUSCULAR | Status: AC
  Administered 2014-08-08: 1000 ug via INTRAMUSCULAR

## 2014-08-08 NOTE — Progress Notes (Signed)
B12 injection to right deltoid with no apparent complications.   

## 2014-09-08 ENCOUNTER — Ambulatory Visit (INDEPENDENT_AMBULATORY_CARE_PROVIDER_SITE_OTHER): Payer: Medicare Other | Admitting: *Deleted

## 2014-09-08 DIAGNOSIS — E538 Deficiency of other specified B group vitamins: Secondary | ICD-10-CM

## 2014-09-08 MED ORDER — CYANOCOBALAMIN 1000 MCG/ML IJ SOLN
1000.0000 ug | Freq: Once | INTRAMUSCULAR | Status: AC
  Administered 2014-09-08: 1000 ug via INTRAMUSCULAR

## 2014-09-08 NOTE — Progress Notes (Signed)
Patient in for B12 injection. 

## 2014-09-11 ENCOUNTER — Other Ambulatory Visit: Payer: Self-pay | Admitting: Family Medicine

## 2014-09-11 ENCOUNTER — Other Ambulatory Visit (HOSPITAL_COMMUNITY)
Admission: RE | Admit: 2014-09-11 | Discharge: 2014-09-11 | Disposition: A | Discharge status: Home / Self Care | Payer: Medicare Other | Source: Ambulatory Visit | Attending: Family Medicine | Admitting: Family Medicine

## 2014-09-11 DIAGNOSIS — Z124 Encounter for screening for malignant neoplasm of cervix: Secondary | ICD-10-CM | POA: Insufficient documentation

## 2014-09-11 DIAGNOSIS — N760 Acute vaginitis: Secondary | ICD-10-CM | POA: Insufficient documentation

## 2014-09-11 DIAGNOSIS — Z1151 Encounter for screening for human papillomavirus (HPV): Secondary | ICD-10-CM | POA: Diagnosis present

## 2014-09-11 DIAGNOSIS — Z113 Encounter for screening for infections with a predominantly sexual mode of transmission: Secondary | ICD-10-CM | POA: Insufficient documentation

## 2014-09-13 LAB — CYTOLOGY - PAP

## 2014-10-05 ENCOUNTER — Encounter: Payer: Self-pay | Admitting: Neurology

## 2014-10-05 ENCOUNTER — Ambulatory Visit (INDEPENDENT_AMBULATORY_CARE_PROVIDER_SITE_OTHER): Payer: Medicare Other | Admitting: Neurology

## 2014-10-05 VITALS — BP 108/62 | HR 86 | Ht 62.0 in | Wt 154.0 lb

## 2014-10-05 DIAGNOSIS — R251 Tremor, unspecified: Secondary | ICD-10-CM

## 2014-10-05 DIAGNOSIS — G609 Hereditary and idiopathic neuropathy, unspecified: Secondary | ICD-10-CM | POA: Diagnosis not present

## 2014-10-05 DIAGNOSIS — E538 Deficiency of other specified B group vitamins: Secondary | ICD-10-CM | POA: Diagnosis not present

## 2014-10-05 MED ORDER — CYANOCOBALAMIN 1000 MCG/ML IJ SOLN
1000.0000 ug | Freq: Once | INTRAMUSCULAR | Status: AC
  Administered 2014-10-05: 1000 ug via INTRAMUSCULAR

## 2014-10-05 NOTE — Progress Notes (Signed)
Subjective:   Jacqueline Flores was seen in consultation in the movement disorder clinic at the request of Dr. Laurann [place].  Pts PCP is [name],[name], MD.  The evaluation is for tremor.  The patient is a 79 y.o. right handed female with a history of tremor.  Tremor just started in the R hand after a fall from a truck 3 weeks ago.  She was getting into her truck and literally fell out attempting to get in.  She fell on the L side of the body and L hip.  Did not hit the head, just the shoulder and ribs and buttocks;  A few days later, she noted tremor of the R hand (husband actually noted it first when it was at rest).  Not noted it when using it, but notes it when ambulating.    Tremor details: Affected by caffeine:  No.  (2-3 medium sized cups per day) Affected by alcohol: unknown Affected by stress:  No. Affected by fatigue:  Yes.   Spills soup if on spoon:  No. Spills glass of liquid if full:  No. Affects ADL's (tying shoes, brushing teeth, etc):  No.  04/04/14 update:  The patient is following up today, earlier than expected.  She is accompanied by her daughter who supplements the history.  She thinks that tremor has increased on the right.  She did have an MRI of the brain since last visit that revealed  atrophy and relatively severe small vessel disease.  She is already on ASA.  She had lab workup since last visit for reversible causes of peripheral neuropathy.  Her B12 was quite low at 199.  She has been started on injections and has been receiving them faithfully.  Her RPR was negative, TSH was normal, and there was no M spike in the serum protein electrophoresis.  There is no monoclonal proteins and urine protein electrophoresis.    She is c/o dry eye and has been to the eye doctor.  She is using drops and a gel in her eyes.  No diplopia.  She has had no falls since last visit, but she has "stumbled some."  Daughter states that she hears the patient "shuffle more."  Daughter states that the  biggest issue is her anxiety/depression as she will cry when she has days of tremor.  She and her daughter talk about her "parkinsons."  10/05/14 update:  The patient is following up today, accompanied by her daughter (a different one than came last visit) who supplements the history.  She attended physical therapy for gait and balance since our last visit.  She states that has really helped and she can even get out of the bathtub now.  She has been faithfully receiving her B12 injections here at the office.  She had a repeat B12 level that was a trough level and it was 473.  In regards to her tremor, it has been about the same.  She has had no falls since our last visit.  No lightheadedness or near syncope.  No hallucinations.   Outside reports reviewed: historical medical records, lab reports and office notes.  Allergies  Allergen Reactions  . Zetia [Ezetimibe]     Swelling   . Sulfa Antibiotics Rash    Outpatient Encounter Prescriptions as of 10/05/2014  Medication Sig  . aspirin 81 MG tablet Take 81 mg by mouth daily.  . Calcium Carbonate (CALCIUM 600) 1500 MG TABS Take by mouth.  . hydrochlorothiazide (HYDRODIURIL) 25 MG tablet Take 25  mg by mouth as needed.  . moexipril (UNIVASC) 7.5 MG tablet Take 7.5 mg by mouth at bedtime.  Marland Kitchen NIFEdipine (PROCARDIA-XL/ADALAT CC) 30 MG 24 hr tablet Take 30 mg by mouth daily.  . Omega-3 Fatty Acids (FISH OIL) 1000 MG CAPS Take 1 capsule by mouth 3 (three) times daily.  Marland Kitchen omeprazole (PRILOSEC) 40 MG capsule Take 40 mg by mouth daily.  . pravastatin (PRAVACHOL) 80 MG tablet Take 80 mg by mouth daily.  . [EXPIRED] cyanocobalamin ((VITAMIN B-12)) injection 1,000 mcg    No facility-administered encounter medications on file as of 10/05/2014.    Past Medical History  Diagnosis Date  . Coronary artery disease     post MI  . MI (myocardial infarction)     age 79  . Hyperlipidemia   . Hypertension   . Esophageal reflux   . Osteoarthritis   . Hematuria    . Osteopenia     Past Surgical History  Procedure Laterality Date  . Nephrolithotomy    . Dilation and curettage of uterus    . Cataract extraction w/ intraocular lens  implant, bilateral    . Lid lift      bilaterally, lid lag    Social History   Social History  . Marital Status: Married    Spouse Name: N/A  . Number of Children: N/A  . Years of Education: N/A   Occupational History  .      [employer]   Social History Main Topics  . Smoking status: Former Smoker -- 30 years    Quit date: 10/28/2010  . Smokeless tobacco: Not on file  . Alcohol Use: Yes     Comment: glass of wine once a month  . Drug Use: No  . Sexual Activity: Not on file   Other Topics Concern  . Not on file   Social History Narrative    Family Status  Relation Status Death Age  . Mother Deceased     Breast Cancer  . Father Deceased 54    MI, CAD  . Sister Alive     healthy  . Maternal Grandmother Deceased     DM  . Daughter Alive     healthy  . Daughter Alive     healthy    Review of Systems Admits to some neck pain.  A complete 10 system ROS was obtained and was negative apart from what is mentioned.   Objective:   VITALS:   Filed Vitals:   10/05/14 0950  BP: 108/62  Pulse: 86  Height: 5\' 2"  (1.575 m)  Weight: 154 lb (69.854 kg)   Gen:  Appears stated age and in NAD. HEENT:  Normocephalic, atraumatic. R conjunctiva is injected.  The mucous membranes are moist. The superficial temporal arteries are without ropiness or tenderness. Cardiovascular: Regular rate and rhythm. Lungs: Clear to auscultation bilaterally. Neck: There are no carotid bruits noted bilaterally.  NEUROLOGICAL:  Orientation:  The patient is alert and oriented x 3.    Cranial nerves: There is good facial symmetry.  Soft palate rises symmetrically and there is no tongue deviation. Hearing is intact to conversational tone. Tone: Tone is good throughout.  No rigidity. Sensation: Sensation is  intact to light touch throughout. Coordination:  The patient has mild slowing of finger taps bilaterally (a little apraxic as well), R more than L and hand opening and closing. Motor: Strength is 5/5 in the bilateral upper and lower extremities.  Shoulder shrug is equal bilaterally.  There  is no pronator drift.  There are no fasciculations noted. Gait and Station: The patient is able to ambulate without difficulty. There is good arm swing but there is right upper extremity tremor with ambulation.    MOVEMENT EXAM: Tremor:  There is no postural tremor.  There is no rest tremor with distraction procedures.     Labs:   Lab Results  Component Value Date   [id number] 979 04/20/2014      Assessment/Plan:   1.  Tremor.  -There is just a very mild rest tremor that increases with ambulation, and she has developed a slight slowness of rapid alternating movements (although she also is apraxic) but she still does not meet formal [place] brain bank criteria for Parkinson's disease.  I again discussed this in detail with the patient and her daughter.  I also discussed with them that this may be coming in the future.  2.  Cerebral small vessel disease  -Relatively significant on her MRI of the brain.  She has a history of hypertension, hyperlipidemia, coronary artery disease and tobacco abuse (and still smokes cigarette "now and then").  Talked about d/c all together.  She will remain on aspirin.  Talked to her about risk factors associated with this degree of small vessel disease.  3.  PN, on examination with identified B12 deficiency  -Physical therapy has helped her balance.  -We'll continue B12 injections; even with the injections, her trough level was only 473, so my recommendation would be to continue these.  I told her she could give them at home if she wishes, but she wants to continue them here. 4.  Follow-up in the next 6 months, sooner should new neurologic issues arise.  Greater than 50% of the 35  minute visit spent in counseling, as above.  Discussed safety in detail.

## 2014-10-10 ENCOUNTER — Ambulatory Visit: Payer: Medicare Other

## 2014-11-07 ENCOUNTER — Ambulatory Visit (INDEPENDENT_AMBULATORY_CARE_PROVIDER_SITE_OTHER): Payer: Medicare Other | Admitting: Neurology

## 2014-11-07 DIAGNOSIS — E538 Deficiency of other specified B group vitamins: Secondary | ICD-10-CM

## 2014-11-07 MED ORDER — CYANOCOBALAMIN 1000 MCG/ML IJ SOLN
1000.0000 ug | Freq: Once | INTRAMUSCULAR | Status: AC
  Administered 2014-11-07: 1000 ug via INTRAMUSCULAR

## 2014-11-07 NOTE — Progress Notes (Signed)
B12 injection to left deltoid with no apparent complications.   

## 2014-12-12 ENCOUNTER — Ambulatory Visit (INDEPENDENT_AMBULATORY_CARE_PROVIDER_SITE_OTHER): Payer: Medicare Other | Admitting: Neurology

## 2014-12-12 DIAGNOSIS — E538 Deficiency of other specified B group vitamins: Secondary | ICD-10-CM

## 2014-12-12 MED ORDER — CYANOCOBALAMIN 1000 MCG/ML IJ SOLN
1000.0000 ug | Freq: Once | INTRAMUSCULAR | Status: AC
  Administered 2014-12-12: 1000 ug via INTRAMUSCULAR

## 2014-12-12 NOTE — Progress Notes (Signed)
B12 injection to left deltoid with no apparent complications.   

## 2015-01-11 ENCOUNTER — Ambulatory Visit (INDEPENDENT_AMBULATORY_CARE_PROVIDER_SITE_OTHER): Payer: Medicare Other | Admitting: Neurology

## 2015-01-11 DIAGNOSIS — E538 Deficiency of other specified B group vitamins: Secondary | ICD-10-CM | POA: Diagnosis not present

## 2015-01-11 MED ORDER — CYANOCOBALAMIN 1000 MCG/ML IJ SOLN
1000.0000 ug | Freq: Once | INTRAMUSCULAR | Status: AC
  Administered 2015-01-11: 1000 ug via INTRAMUSCULAR

## 2015-01-11 NOTE — Progress Notes (Signed)
B12 injection to left deltoid with no apparent complications.   

## 2015-02-13 ENCOUNTER — Ambulatory Visit: Payer: Medicare Other

## 2015-03-28 ENCOUNTER — Telehealth: Payer: Self-pay | Admitting: Neurology

## 2015-03-28 NOTE — Telephone Encounter (Signed)
Pt needs to know if [employer] will pay for the B-12 injections please call [phone]

## 2015-03-29 NOTE — Telephone Encounter (Signed)
LMOM making patient aware I do not know the answer to this and she should contact her insurance plan.

## 2015-04-04 ENCOUNTER — Ambulatory Visit (INDEPENDENT_AMBULATORY_CARE_PROVIDER_SITE_OTHER): Payer: Medicare HMO | Admitting: Neurology

## 2015-04-04 ENCOUNTER — Encounter: Payer: Self-pay | Admitting: Neurology

## 2015-04-04 VITALS — BP 108/68 | HR 84 | Ht 63.0 in | Wt 161.0 lb

## 2015-04-04 DIAGNOSIS — R251 Tremor, unspecified: Secondary | ICD-10-CM | POA: Diagnosis not present

## 2015-04-04 DIAGNOSIS — E538 Deficiency of other specified B group vitamins: Secondary | ICD-10-CM | POA: Diagnosis not present

## 2015-04-04 MED ORDER — CYANOCOBALAMIN 1000 MCG/ML IJ SOLN
1000.0000 ug | Freq: Once | INTRAMUSCULAR | Status: AC
  Administered 2015-04-04: 1000 ug via INTRAMUSCULAR

## 2015-04-04 NOTE — Progress Notes (Signed)
Subjective:   Jacqueline Flores was seen in consultation in the movement disorder clinic at the request of Dr. Laurann Montana.  Pts PCP is [name],[name], MD.  The evaluation is for tremor.  The patient is a 80 y.o. right handed female with a history of tremor.  Tremor just started in the R hand after a fall from a truck 3 weeks ago.  She was getting into her truck and literally fell out attempting to get in.  She fell on the L side of the body and L hip.  Did not hit the head, just the shoulder and ribs and buttocks;  A few days later, she noted tremor of the R hand (husband actually noted it first when it was at rest).  Not noted it when using it, but notes it when ambulating.    Tremor details: Affected by caffeine:  No.  (2-3 medium sized cups per day) Affected by alcohol: unknown Affected by stress:  No. Affected by fatigue:  Yes.   Spills soup if on spoon:  No. Spills glass of liquid if full:  No. Affects ADL's (tying shoes, brushing teeth, etc):  No.  04/04/14 update:  The patient is following up today, earlier than expected.  She is accompanied by her daughter who supplements the history.  She thinks that tremor has increased on the right.  She did have an MRI of the brain since last visit that revealed  atrophy and relatively severe small vessel disease.  She is already on ASA.  She had lab workup since last visit for reversible causes of peripheral neuropathy.  Her B12 was quite low at 199.  She has been started on injections and has been receiving them faithfully.  Her RPR was negative, TSH was normal, and there was no M spike in the serum protein electrophoresis.  There is no monoclonal proteins and urine protein electrophoresis.    She is c/o dry eye and has been to the eye doctor.  She is using drops and a gel in her eyes.  No diplopia.  She has had no falls since last visit, but she has "stumbled some."  Daughter states that she hears the patient "shuffle more."  Daughter states that the  biggest issue is her anxiety/depression as she will cry when she has days of tremor.  She and her daughter talk about her "parkinsons."  10/05/14 update:  The patient is following up today, accompanied by her daughter (a different one than came last visit) who supplements the history.  She attended physical therapy for gait and balance since our last visit.  She states that has really helped and she can even get out of the bathtub now.  She has been faithfully receiving her B12 injections here at the office.  She had a repeat B12 level that was a trough level and it was 473.  In regards to her tremor, it has been about the same.  She has had no falls since our last visit.  No lightheadedness or near syncope.  No hallucinations.  04/04/15 update:  The patient follows up today, accompanied by her daughter who supplements the history.  She has a history of B12 deficiency and was receiving B12 injections faithfully last year, but has not come this year for injections.  She states that she had bronchitis and just didn't want to come in for injections; she requests one today.  In regards to tremor, the patient states that it is about the same.  She denies  any falls since our last visit.  She denies lightheadedness or near syncope.  She does have a history of rather significant cerebral small vessel disease and was occasionally smoking last visit, and I encouraged her to discontinue that altogether.  She has done that.  She is still taking her aspirin faithfully.  She is exercising faithfully and states that she can get in and out of the bathtub and didn't used to be able to do that.  She is really pleased.     Outside reports reviewed: historical medical records, lab reports and office notes.  Allergies  Allergen Reactions  . Zetia [Ezetimibe]     Swelling   . Sulfa Antibiotics Rash    Outpatient Encounter Prescriptions as of 04/04/2015  Medication Sig  . aspirin 81 MG tablet Take 81 mg by mouth daily.  .  Calcium Carbonate (CALCIUM 600) 1500 MG TABS Take by mouth.  . hydrochlorothiazide (HYDRODIURIL) 25 MG tablet Take 25 mg by mouth as needed.  . moexipril (UNIVASC) 7.5 MG tablet Take 7.5 mg by mouth at bedtime.  Marland Kitchen NIFEdipine (PROCARDIA-XL/ADALAT CC) 30 MG 24 hr tablet Take 30 mg by mouth daily.  . Omega-3 Fatty Acids (FISH OIL) 1000 MG CAPS Take 1 capsule by mouth 3 (three) times daily.  Marland Kitchen omeprazole (PRILOSEC) 40 MG capsule Take 40 mg by mouth daily.  . pravastatin (PRAVACHOL) 80 MG tablet Take 80 mg by mouth daily.  . [EXPIRED] cyanocobalamin ((VITAMIN B-12)) injection 1,000 mcg    No facility-administered encounter medications on file as of 04/04/2015.    Past Medical History  Diagnosis Date  . Coronary artery disease     post MI  . MI (myocardial infarction) ([facility])     age 33  . Hyperlipidemia   . Hypertension   . Esophageal reflux   . Osteoarthritis   . Hematuria   . Osteopenia     Past Surgical History  Procedure Laterality Date  . Nephrolithotomy    . Dilation and curettage of uterus    . Cataract extraction w/ intraocular lens  implant, bilateral    . Lid lift      bilaterally, lid lag    Social History   Social History  . Marital Status: Married    Spouse Name: N/A  . Number of Children: N/A  . Years of Education: N/A   Occupational History  .      [employer]   Social History Main Topics  . Smoking status: Former Smoker -- 30 years    Quit date: 10/28/2010  . Smokeless tobacco: Not on file  . Alcohol Use: Yes     Comment: glass of wine once a month  . Drug Use: No  . Sexual Activity: Not on file   Other Topics Concern  . Not on file   Social History Narrative    Family Status  Relation Status Death Age  . Mother Deceased     Breast Cancer  . Father Deceased 59    MI, CAD  . Sister Alive     healthy  . Maternal Grandmother Deceased     DM  . Daughter Alive     healthy  . Daughter Alive     healthy    Review of Systems Admits  to some neck pain.  A complete 10 system ROS was obtained and was negative apart from what is mentioned.   Objective:   VITALS:   Filed Vitals:   04/04/15 1031  BP: 108/68  Pulse: 84  Height: 5\' 3"  (1.6 m)  Weight: 161 lb (73.029 kg)   Gen:  Appears stated age and in NAD. HEENT:  Normocephalic, atraumatic. R conjunctiva is injected.  The mucous membranes are moist. The superficial temporal arteries are without ropiness or tenderness. Cardiovascular: Regular rate and rhythm. Lungs: Clear to auscultation bilaterally. Neck: There are no carotid bruits noted bilaterally.  NEUROLOGICAL:  Orientation:  The patient is alert and oriented x 3.    Cranial nerves: There is good facial symmetry.  Soft palate rises symmetrically and there is no tongue deviation. Hearing is intact to conversational tone. Tone: Tone is good throughout.  No rigidity. Sensation: Sensation is intact to light touch throughout. Coordination:  The patient has mild slowing of finger taps bilaterally (a little apraxic as well), R more than L and hand opening and closing (this is stable from previous visits). Motor: Strength is 5/5 in the bilateral upper and lower extremities.  Shoulder shrug is equal bilaterally.  There is no pronator drift.  There are no fasciculations noted. Gait and Station: The patient is able to ambulate without difficulty. There is good arm swing but there is right upper extremity tremor with ambulation.    MOVEMENT EXAM: Tremor:  There is no postural tremor.  There is no rest tremor with distraction procedures.  No trouble with archimedes spirals   Labs:   Lab Results  Component Value Date   VITAMINB12 473 04/20/2014      Assessment/Plan:   1.  Tremor.  -There is just a very mild rest tremor that increases with ambulation, and she has developed a slight slowness of rapid alternating movements (although she also is apraxic) but she still does not meet formal [place] brain bank criteria for  Parkinson's disease.  I again discussed this in detail with the patient and her daughter.  I also discussed with them that this may be coming in the future.  I think that she needs continued monitoring  2.  Cerebral small vessel disease  -Relatively significant on her MRI of the brain.  She has a history of hypertension, hyperlipidemia, coronary artery disease and tobacco abuse hx.  She has d/c the tobacco since last visit. She will remain on aspirin.  Talked to her about risk factors associated with this degree of small vessel disease.  3.  PN, on examination with identified B12 deficiency  -Physical therapy has helped her balance.  -We'll continue B12 injections; even with the injections, her trough level was only 473, so my recommendation would be to continue these.  I told her she could give them at home if she wishes, but she wants to continue them here.  Was given injection here today.  She has missed the last few months.  4.  Follow-up in the next 6 months, sooner should new neurologic issues arise.  Greater than 50% of the 25 minute visit spent in counseling, as above.  Discussed safety in detail.

## 2015-04-05 DIAGNOSIS — I119 Hypertensive heart disease without heart failure: Secondary | ICD-10-CM | POA: Diagnosis not present

## 2015-04-05 DIAGNOSIS — E7800 Pure hypercholesterolemia, unspecified: Secondary | ICD-10-CM | POA: Diagnosis not present

## 2015-04-05 DIAGNOSIS — G20 Parkinson's disease: Secondary | ICD-10-CM | POA: Diagnosis not present

## 2015-04-06 DIAGNOSIS — Z23 Encounter for immunization: Secondary | ICD-10-CM | POA: Diagnosis not present

## 2015-04-12 NOTE — Therapy (Signed)
[facility] [place], [place], [address] Phone: [phone]   Fax:  [phone]  Physical Therapy Treatment  Patient Details  Name: Jacqueline Flores MRN: [id number] Date of Birth: 1935/06/13 No Data Recorded  Encounter Date: 04/26/2014    Past Medical History  Diagnosis Date  . Coronary artery disease     post MI  . MI (myocardial infarction) ([facility])     age 80  . Hyperlipidemia   . Hypertension   . Esophageal reflux   . Osteoarthritis   . Hematuria   . Osteopenia     Past Surgical History  Procedure Laterality Date  . Nephrolithotomy    . Dilation and curettage of uterus    . Cataract extraction w/ intraocular lens  implant, bilateral    . Lid lift      bilaterally, lid lag    There were no vitals filed for this visit.  Visit Diagnosis:  Gait instability                                    PT Long Term Goals - 05/01/14 0948    PT LONG TERM GOAL #1   Status Achieved   PT LONG TERM GOAL #2   Status Partially Met               Problem List Patient Active Problem List   Diagnosis Date Noted  . Pure hypercholesterolemia 03/10/2013  . Coronary atherosclerosis of unspecified type of vessel, native or graft 03/10/2013  . Esophageal reflux 03/10/2013  . Benign hypertensive heart disease without heart failure 03/10/2013  . Old myocardial infarction 03/10/2013  . Disorder of bone and cartilage, unspecified 03/10/2013   PHYSICAL THERAPY DISCHARGE SUMMARY  Visits from Start of Care: 4  Current functional level related to goals / functional outcomes: Please see above.   Remaining deficits: Good progression toward goals.   Education / Equipment: Patient discharging to our self-directed gym program.  Plan: Patient agrees to discharge.  Patient goals were not met. Patient is being discharged due to meeting the stated rehab goals.  ?????      Jacqueline Flores, Jacqueline Flores  MPT 04/12/2015, 1:31 PM  [facility] [address] [place], [place], [address] Phone: [phone]   Fax:  [phone]  Name: Jacqueline Flores MRN: [id number] Date of Birth: Aug 18, 1935

## 2015-05-08 ENCOUNTER — Ambulatory Visit (INDEPENDENT_AMBULATORY_CARE_PROVIDER_SITE_OTHER): Payer: Medicare HMO | Admitting: Neurology

## 2015-05-08 DIAGNOSIS — E538 Deficiency of other specified B group vitamins: Secondary | ICD-10-CM

## 2015-05-08 MED ORDER — CYANOCOBALAMIN 1000 MCG/ML IJ SOLN
1000.0000 ug | Freq: Once | INTRAMUSCULAR | Status: AC
  Administered 2015-05-08: 1000 ug via INTRAMUSCULAR

## 2015-05-08 NOTE — Progress Notes (Signed)
B12 injection to left deltoid with no apparent complications.   

## 2015-05-21 DIAGNOSIS — Z803 Family history of malignant neoplasm of breast: Secondary | ICD-10-CM | POA: Diagnosis not present

## 2015-05-21 DIAGNOSIS — Z1231 Encounter for screening mammogram for malignant neoplasm of breast: Secondary | ICD-10-CM | POA: Diagnosis not present

## 2015-05-28 DIAGNOSIS — H04123 Dry eye syndrome of bilateral lacrimal glands: Secondary | ICD-10-CM | POA: Diagnosis not present

## 2015-05-28 DIAGNOSIS — Z961 Presence of intraocular lens: Secondary | ICD-10-CM | POA: Diagnosis not present

## 2015-06-04 ENCOUNTER — Ambulatory Visit (INDEPENDENT_AMBULATORY_CARE_PROVIDER_SITE_OTHER): Payer: Medicare HMO | Admitting: Neurology

## 2015-06-04 DIAGNOSIS — E538 Deficiency of other specified B group vitamins: Secondary | ICD-10-CM | POA: Diagnosis not present

## 2015-06-04 MED ORDER — CYANOCOBALAMIN 1000 MCG/ML IJ SOLN
1000.0000 ug | Freq: Once | INTRAMUSCULAR | Status: AC
  Administered 2015-06-04: 1000 ug via INTRAMUSCULAR

## 2015-06-04 NOTE — Progress Notes (Signed)
B12 injection to left deltoid with no apparent complications.   

## 2015-06-05 ENCOUNTER — Ambulatory Visit: Payer: Medicare HMO

## 2015-06-14 DIAGNOSIS — L814 Other melanin hyperpigmentation: Secondary | ICD-10-CM | POA: Diagnosis not present

## 2015-06-14 DIAGNOSIS — L821 Other seborrheic keratosis: Secondary | ICD-10-CM | POA: Diagnosis not present

## 2015-06-14 DIAGNOSIS — L570 Actinic keratosis: Secondary | ICD-10-CM | POA: Diagnosis not present

## 2015-06-14 DIAGNOSIS — D1801 Hemangioma of skin and subcutaneous tissue: Secondary | ICD-10-CM | POA: Diagnosis not present

## 2015-06-14 DIAGNOSIS — I8312 Varicose veins of left lower extremity with inflammation: Secondary | ICD-10-CM | POA: Diagnosis not present

## 2015-06-14 DIAGNOSIS — D2262 Melanocytic nevi of left upper limb, including shoulder: Secondary | ICD-10-CM | POA: Diagnosis not present

## 2015-06-14 DIAGNOSIS — I8311 Varicose veins of right lower extremity with inflammation: Secondary | ICD-10-CM | POA: Diagnosis not present

## 2015-06-14 DIAGNOSIS — I872 Venous insufficiency (chronic) (peripheral): Secondary | ICD-10-CM | POA: Diagnosis not present

## 2015-07-05 ENCOUNTER — Ambulatory Visit (INDEPENDENT_AMBULATORY_CARE_PROVIDER_SITE_OTHER): Payer: Medicare HMO | Admitting: Neurology

## 2015-07-05 DIAGNOSIS — E538 Deficiency of other specified B group vitamins: Secondary | ICD-10-CM

## 2015-07-05 MED ORDER — CYANOCOBALAMIN 1000 MCG/ML IJ SOLN
1000.0000 ug | Freq: Once | INTRAMUSCULAR | Status: AC
  Administered 2015-07-05: 1000 ug via INTRAMUSCULAR

## 2015-07-05 NOTE — Progress Notes (Signed)
B12 injection to left deltoid with no apparent complications.   

## 2015-08-14 ENCOUNTER — Ambulatory Visit (INDEPENDENT_AMBULATORY_CARE_PROVIDER_SITE_OTHER): Payer: Medicare HMO | Admitting: Neurology

## 2015-08-14 DIAGNOSIS — E538 Deficiency of other specified B group vitamins: Secondary | ICD-10-CM

## 2015-08-14 MED ORDER — CYANOCOBALAMIN 1000 MCG/ML IJ SOLN
1000.0000 ug | Freq: Once | INTRAMUSCULAR | Status: AC
  Administered 2015-08-14: 1000 ug via INTRAMUSCULAR

## 2015-08-14 NOTE — Progress Notes (Signed)
B12 injection to left deltoid with no apparent complications.   

## 2015-09-14 ENCOUNTER — Ambulatory Visit (INDEPENDENT_AMBULATORY_CARE_PROVIDER_SITE_OTHER): Payer: Medicare HMO | Admitting: Neurology

## 2015-09-14 DIAGNOSIS — E538 Deficiency of other specified B group vitamins: Secondary | ICD-10-CM | POA: Diagnosis not present

## 2015-09-14 MED ORDER — CYANOCOBALAMIN 1000 MCG/ML IJ SOLN
1000.0000 ug | Freq: Once | INTRAMUSCULAR | Status: AC
  Administered 2015-09-14: 1000 ug via INTRAMUSCULAR

## 2015-09-14 NOTE — Progress Notes (Signed)
B12 injection to left deltoid with no apparent complications.   

## 2015-10-03 NOTE — Progress Notes (Signed)
Subjective:   Jacqueline Flores was seen in consultation in the movement disorder clinic at the request of Dr. Laurann Montana.  Pts PCP is [name],[name], MD.  The evaluation is for tremor.  The patient is a 80 y.o. right handed female with a history of tremor.  Tremor just started in the R hand after a fall from a truck 3 weeks ago.  She was getting into her truck and literally fell out attempting to get in.  She fell on the L side of the body and L hip.  Did not hit the head, just the shoulder and ribs and buttocks;  A few days later, she noted tremor of the R hand (husband actually noted it first when it was at rest).  Not noted it when using it, but notes it when ambulating.    Tremor details: Affected by caffeine:  No.  (2-3 medium sized cups per day) Affected by alcohol: unknown Affected by stress:  No. Affected by fatigue:  Yes.   Spills soup if on spoon:  No. Spills glass of liquid if full:  No. Affects ADL's (tying shoes, brushing teeth, etc):  No.  04/04/14 update:  The patient is following up today, earlier than expected.  She is accompanied by her daughter who supplements the history.  She thinks that tremor has increased on the right.  She did have an MRI of the brain since last visit that revealed  atrophy and relatively severe small vessel disease.  She is already on ASA.  She had lab workup since last visit for reversible causes of peripheral neuropathy.  Her B12 was quite low at 199.  She has been started on injections and has been receiving them faithfully.  Her RPR was negative, TSH was normal, and there was no M spike in the serum protein electrophoresis.  There is no monoclonal proteins and urine protein electrophoresis.    She is c/o dry eye and has been to the eye doctor.  She is using drops and a gel in her eyes.  No diplopia.  She has had no falls since last visit, but she has "stumbled some."  Daughter states that she hears the patient "shuffle more."  Daughter states that the  biggest issue is her anxiety/depression as she will cry when she has days of tremor.  She and her daughter talk about her "parkinsons."  10/05/14 update:  The patient is following up today, accompanied by her daughter (a different one than came last visit) who supplements the history.  She attended physical therapy for gait and balance since our last visit.  She states that has really helped and she can even get out of the bathtub now.  She has been faithfully receiving her B12 injections here at the office.  She had a repeat B12 level that was a trough level and it was 473.  In regards to her tremor, it has been about the same.  She has had no falls since our last visit.  No lightheadedness or near syncope.  No hallucinations.  04/04/15 update:  The patient follows up today, accompanied by her daughter who supplements the history.  She has a history of B12 deficiency and was receiving B12 injections faithfully last year, but has not come this year for injections.  She states that she had bronchitis and just didn't want to come in for injections; she requests one today.  In regards to tremor, the patient states that it is about the same.  She denies  any falls since our last visit.  She denies lightheadedness or near syncope.  She does have a history of rather significant cerebral small vessel disease and was occasionally smoking last visit, and I encouraged her to discontinue that altogether.  She has done that.  She is still taking her aspirin faithfully.  She is exercising faithfully and states that she can get in and out of the bathtub and didn't used to be able to do that.  She is really pleased.    10/05/15 update:   She reports doing well over the last 6 months.  She fell one time down basement stairs because her knee gave out and she broke 3 toes.  [place] beach and thought she was at bottom stair and she wasn't.  She didn't get hurt that time.    No hallucinations.  No lightheadedness or  near syncope.  She is exercising.  She is receiving monthly B12 injections for her B12 deficiency, the last of which was 09/14/15.  She still isn't smoking.  Isn't exercising as faithful.     Outside reports reviewed: historical medical records, lab reports and office notes.  Allergies  Allergen Reactions  . Zetia [Ezetimibe]     Swelling   . Sulfa Antibiotics Rash    Outpatient Encounter Prescriptions as of 10/05/2015  Medication Sig  . aspirin 81 MG tablet Take 81 mg by mouth daily.  . Calcium Carbonate (CALCIUM 600) 1500 MG TABS Take by mouth.  . hydrochlorothiazide (HYDRODIURIL) 25 MG tablet Take 25 mg by mouth as needed.  . moexipril (UNIVASC) 7.5 MG tablet Take 7.5 mg by mouth at bedtime.  Marland Kitchen NIFEdipine (PROCARDIA-XL/ADALAT CC) 30 MG 24 hr tablet Take 30 mg by mouth daily.  . Omega-3 Fatty Acids (FISH OIL) 1000 MG CAPS Take 1 capsule by mouth 3 (three) times daily.  Marland Kitchen omeprazole (PRILOSEC) 40 MG capsule Take 40 mg by mouth daily.  . pravastatin (PRAVACHOL) 80 MG tablet Take 80 mg by mouth daily.   No facility-administered encounter medications on file as of 10/05/2015.     Past Medical History:  Diagnosis Date  . Coronary artery disease    post MI  . Esophageal reflux   . Hematuria   . Hyperlipidemia   . Hypertension   . MI (myocardial infarction) ([place] [facility])    age 61  . Osteoarthritis   . Osteopenia     Past Surgical History:  Procedure Laterality Date  . CATARACT EXTRACTION W/ INTRAOCULAR LENS  IMPLANT, BILATERAL    . DILATION AND CURETTAGE OF UTERUS    . lid lift     bilaterally, lid lag  . NEPHROLITHOTOMY      Social History   Social History  . Marital status: Married    Spouse name: N/A  . Number of children: N/A  . Years of education: N/A   Occupational History  .  Retired    [employer]   Social History Main Topics  . Smoking status: Former Smoker    Years: 30.00    Quit date: 10/28/2010  . Smokeless tobacco: Not on file  . Alcohol use Yes       Comment: glass of wine once a month  . Drug use: No  . Sexual activity: Not on file   Other Topics Concern  . Not on file   Social History Narrative  . No narrative on file    Family Status  Relation Status  . Mother Deceased  Breast Cancer  . Father Deceased at age 67   MI, CAD  . Sister Alive   healthy  . Maternal Grandmother Deceased   DM  . Daughter Alive   healthy  . Daughter Alive   healthy    Review of Systems Admits to some neck pain.  A complete 10 system ROS was obtained and was negative apart from what is mentioned.   Objective:   VITALS:   Vitals:   10/05/15 1000  BP: 100/64  BP Location: Left Arm  Patient Position: Sitting  Cuff Size: Normal  Pulse: 76  Weight: 154 lb (69.9 kg)  Height: 5\' 3"  (1.6 m)   Gen:  Appears stated age and in NAD. HEENT:  Normocephalic, atraumatic. R conjunctiva is injected.  The mucous membranes are moist. The superficial temporal arteries are without ropiness or tenderness. Cardiovascular: Regular rate and rhythm. Lungs: Clear to auscultation bilaterally. Neck: There are no carotid bruits noted bilaterally.  NEUROLOGICAL:  Orientation:  The patient is alert and oriented x 3.    Cranial nerves: There is good facial symmetry.  Soft palate rises symmetrically and there is no tongue deviation. Hearing is intact to conversational tone. Tone: Tone is good throughout.  No rigidity. Sensation: Sensation is intact to light touch throughout. Coordination:  The patient has slowing and decremation with finger taps, hand opening and closing, alternation of supination/pronation of the forearm on the right.  Heel taps/toe taps are good bilaterally.   Motor: Strength is 5/5 in the bilateral upper and lower extremities.  Shoulder shrug is equal bilaterally.  There is no pronator drift.  There are no fasciculations noted. Gait and Station: The patient arises out the chair without difficulty.  She just slightly shuffles although  stride length is good. There is good arm swing but there is right upper extremity tremor with ambulation.  There is a positive pull test.  MOVEMENT EXAM: Tremor:  There is no postural tremor.  There is no rest tremor with distraction procedures.  No trouble with archimedes spirals   Labs:   Lab Results  Component Value Date   VITAMINB12 473 04/20/2014      Assessment/Plan:   1.  Tremor.  -She has definitely become more parkinsonian but she still doesn't meet formal criteria for parkinsons disease.  She and I talked about this today.  We talked about the value of exercise today.    2.  Cerebral small vessel disease  -Relatively significant on her MRI of the brain.  She has a history of hypertension, hyperlipidemia, coronary artery disease and tobacco abuse hx.  She has quit smoking.  She will remain on aspirin.  Talked to her about risk factors associated with this degree of small vessel disease.  3.  PN, on examination with identified B12 deficiency  -Physical therapy has helped her balance in the past.    -We'll continue B12 injections.  Offered to have her give at home but she doesn't wish to do that.    4.  Follow-up in the next 4 months, sooner should new neurologic issues arise.  Greater than 50% of the 25 minute visit spent in counseling, as above.  Discussed safety in detail.

## 2015-10-05 ENCOUNTER — Telehealth: Payer: Self-pay | Admitting: Neurology

## 2015-10-05 ENCOUNTER — Encounter: Payer: Self-pay | Admitting: Neurology

## 2015-10-05 ENCOUNTER — Ambulatory Visit (INDEPENDENT_AMBULATORY_CARE_PROVIDER_SITE_OTHER): Payer: Medicare HMO | Admitting: Neurology

## 2015-10-05 VITALS — BP 100/64 | HR 76 | Ht 63.0 in | Wt 154.0 lb

## 2015-10-05 DIAGNOSIS — G20 Parkinson's disease: Secondary | ICD-10-CM | POA: Diagnosis not present

## 2015-10-05 DIAGNOSIS — E538 Deficiency of other specified B group vitamins: Secondary | ICD-10-CM | POA: Diagnosis not present

## 2015-10-05 DIAGNOSIS — R251 Tremor, unspecified: Secondary | ICD-10-CM

## 2015-10-05 MED ORDER — CYANOCOBALAMIN 1000 MCG/ML IJ SOLN
1000.0000 ug | Freq: Once | INTRAMUSCULAR | Status: AC
  Administered 2015-10-05: 1000 ug via INTRAMUSCULAR

## 2015-10-05 NOTE — Telephone Encounter (Signed)
Patient daughter Jacqueline Flores would like to talk to someone about todays appt her mother is confused about what was said please call [phone]

## 2015-10-05 NOTE — Telephone Encounter (Signed)
Spoke with patient's daughter. Made her aware patient looking more parkinsonian but does not yet meet criteria for parkinson's disease.

## 2015-10-05 NOTE — Addendum Note (Signed)
Addended byAnnamaria Helling on: 10/05/2015 10:35 AM   Modules accepted: Orders

## 2015-10-16 ENCOUNTER — Ambulatory Visit: Payer: Medicare HMO

## 2015-10-25 DIAGNOSIS — K219 Gastro-esophageal reflux disease without esophagitis: Secondary | ICD-10-CM | POA: Diagnosis not present

## 2015-10-25 DIAGNOSIS — E7800 Pure hypercholesterolemia, unspecified: Secondary | ICD-10-CM | POA: Diagnosis not present

## 2015-10-25 DIAGNOSIS — I10 Essential (primary) hypertension: Secondary | ICD-10-CM | POA: Diagnosis not present

## 2015-10-25 DIAGNOSIS — G20 Parkinson's disease: Secondary | ICD-10-CM | POA: Diagnosis not present

## 2015-10-25 DIAGNOSIS — Z0000 Encounter for general adult medical examination without abnormal findings: Secondary | ICD-10-CM | POA: Diagnosis not present

## 2015-11-06 ENCOUNTER — Ambulatory Visit: Payer: Medicare HMO

## 2015-11-06 ENCOUNTER — Ambulatory Visit (INDEPENDENT_AMBULATORY_CARE_PROVIDER_SITE_OTHER): Payer: Medicare HMO | Admitting: Neurology

## 2015-11-06 DIAGNOSIS — E538 Deficiency of other specified B group vitamins: Secondary | ICD-10-CM | POA: Diagnosis not present

## 2015-11-06 MED ORDER — CYANOCOBALAMIN 1000 MCG/ML IJ SOLN
1000.0000 ug | Freq: Once | INTRAMUSCULAR | Status: AC
  Administered 2015-11-06: 1000 ug via INTRAMUSCULAR

## 2015-11-06 NOTE — Progress Notes (Signed)
B12 injection to right deltoid with no apparent complications.   

## 2015-11-20 DIAGNOSIS — R69 Illness, unspecified: Secondary | ICD-10-CM | POA: Diagnosis not present

## 2015-12-06 ENCOUNTER — Ambulatory Visit (INDEPENDENT_AMBULATORY_CARE_PROVIDER_SITE_OTHER): Payer: Medicare HMO | Admitting: Neurology

## 2015-12-06 DIAGNOSIS — E538 Deficiency of other specified B group vitamins: Secondary | ICD-10-CM | POA: Diagnosis not present

## 2015-12-06 MED ORDER — CYANOCOBALAMIN 1000 MCG/ML IJ SOLN
1000.0000 ug | Freq: Once | INTRAMUSCULAR | Status: AC
  Administered 2015-12-06: 1000 ug via INTRAMUSCULAR

## 2015-12-19 DIAGNOSIS — Z961 Presence of intraocular lens: Secondary | ICD-10-CM | POA: Diagnosis not present

## 2016-01-08 ENCOUNTER — Ambulatory Visit (INDEPENDENT_AMBULATORY_CARE_PROVIDER_SITE_OTHER): Payer: Medicare HMO

## 2016-01-08 DIAGNOSIS — E538 Deficiency of other specified B group vitamins: Secondary | ICD-10-CM | POA: Diagnosis not present

## 2016-01-08 MED ORDER — CYANOCOBALAMIN 1000 MCG/ML IJ SOLN
1000.0000 ug | Freq: Once | INTRAMUSCULAR | Status: AC
  Administered 2016-01-08: 1000 ug via INTRAMUSCULAR

## 2016-01-24 NOTE — Progress Notes (Signed)
Subjective:   Jacqueline Flores was seen in consultation in the movement disorder clinic at the request of Dr. Laurann Montana.  Pts PCP is [name],[name], MD.  The evaluation is for tremor.  The patient is a 80 y.o. right handed female with a history of tremor.  Tremor just started in the R hand after a fall from a truck 3 weeks ago.  She was getting into her truck and literally fell out attempting to get in.  She fell on the L side of the body and L hip.  Did not hit the head, just the shoulder and ribs and buttocks;  A few days later, she noted tremor of the R hand (husband actually noted it first when it was at rest).  Not noted it when using it, but notes it when ambulating.    Tremor details: Affected by caffeine:  No.  (2-3 medium sized cups per day) Affected by alcohol: unknown Affected by stress:  No. Affected by fatigue:  Yes.   Spills soup if on spoon:  No. Spills glass of liquid if full:  No. Affects ADL's (tying shoes, brushing teeth, etc):  No.  04/04/14 update:  The patient is following up today, earlier than expected.  She is accompanied by her daughter who supplements the history.  She thinks that tremor has increased on the right.  She did have an MRI of the brain since last visit that revealed  atrophy and relatively severe small vessel disease.  She is already on ASA.  She had lab workup since last visit for reversible causes of peripheral neuropathy.  Her B12 was quite low at 199.  She has been started on injections and has been receiving them faithfully.  Her RPR was negative, TSH was normal, and there was no M spike in the serum protein electrophoresis.  There is no monoclonal proteins and urine protein electrophoresis.    She is c/o dry eye and has been to the eye doctor.  She is using drops and a gel in her eyes.  No diplopia.  She has had no falls since last visit, but she has "stumbled some."  Daughter states that she hears the patient "shuffle more."  Daughter states that the  biggest issue is her anxiety/depression as she will cry when she has days of tremor.  She and her daughter talk about her "parkinsons."  10/05/14 update:  The patient is following up today, accompanied by her daughter (a different one than came last visit) who supplements the history.  She attended physical therapy for gait and balance since our last visit.  She states that has really helped and she can even get out of the bathtub now.  She has been faithfully receiving her B12 injections here at the office.  She had a repeat B12 level that was a trough level and it was 473.  In regards to her tremor, it has been about the same.  She has had no falls since our last visit.  No lightheadedness or near syncope.  No hallucinations.  04/04/15 update:  The patient follows up today, accompanied by her daughter who supplements the history.  She has a history of B12 deficiency and was receiving B12 injections faithfully last year, but has not come this year for injections.  She states that she had bronchitis and just didn't want to come in for injections; she requests one today.  In regards to tremor, the patient states that it is about the same.  She denies  any falls since our last visit.  She denies lightheadedness or near syncope.  She does have a history of rather significant cerebral small vessel disease and was occasionally smoking last visit, and I encouraged her to discontinue that altogether.  She has done that.  She is still taking her aspirin faithfully.  She is exercising faithfully and states that she can get in and out of the bathtub and didn't used to be able to do that.  She is really pleased.    10/05/15 update:   She reports doing well over the last 6 months.  She fell one time down basement stairs because her knee gave out and she broke 3 toes.  She also [place] stair at beach and thought she was at bottom stair and she wasn't.  She didn't get hurt that time.    No hallucinations.  No lightheadedness or  near syncope.  She is exercising.  She is receiving monthly B12 injections for her B12 deficiency, the last of which was 09/14/15.  She still isn't smoking.  Isn't exercising as faithful.    02/05/16 update:  Patient follows up today.  She has been receiving B12 injections for B12 deficiency.  Overall, the patient thinks that she has been stable.  Pt denies falls.  Pt denies lightheadedness, near syncope.  No hallucinations.  Mood has been good.  She hasn't been doing as much exercise.     Outside reports reviewed: historical medical records, lab reports and office notes.  Allergies  Allergen Reactions  . Zetia [Ezetimibe]     Swelling   . Sulfa Antibiotics Rash    Outpatient Encounter Prescriptions as of 02/05/2016  Medication Sig  . aspirin 81 MG tablet Take 81 mg by mouth daily.  . Calcium Carbonate (CALCIUM 600) 1500 MG TABS Take by mouth.  . hydrochlorothiazide (HYDRODIURIL) 25 MG tablet Take 25 mg by mouth as needed.  . moexipril (UNIVASC) 7.5 MG tablet Take 7.5 mg by mouth at bedtime.  Marland Kitchen NIFEdipine (PROCARDIA-XL/ADALAT CC) 30 MG 24 hr tablet Take 30 mg by mouth daily.  . Omega-3 Fatty Acids (FISH OIL) 1000 MG CAPS Take 1 capsule by mouth 3 (three) times daily.  Marland Kitchen omeprazole (PRILOSEC) 40 MG capsule Take 40 mg by mouth daily.  . pravastatin (PRAVACHOL) 80 MG tablet Take 80 mg by mouth daily.  . [EXPIRED] cyanocobalamin ((VITAMIN B-12)) injection 1,000 mcg    No facility-administered encounter medications on file as of 02/05/2016.     Past Medical History:  Diagnosis Date  . Coronary artery disease    post MI  . Esophageal reflux   . Hematuria   . Hyperlipidemia   . Hypertension   . MI (myocardial infarction)    age 33  . Osteoarthritis   . Osteopenia     Past Surgical History:  Procedure Laterality Date  . CATARACT EXTRACTION W/ INTRAOCULAR LENS  IMPLANT, BILATERAL    . DILATION AND CURETTAGE OF UTERUS    . lid lift     bilaterally, lid lag  . NEPHROLITHOTOMY       Social History   Social History  . Marital status: Married    Spouse name: N/A  . Number of children: N/A  . Years of education: N/A   Occupational History  .  Retired    [employer]   Social History Main Topics  . Smoking status: Former Smoker    Years: 30.00    Quit date: 10/28/2010  . Smokeless tobacco: Not on  file  . Alcohol use Yes     Comment: glass of wine once a month  . Drug use: No  . Sexual activity: Not on file   Other Topics Concern  . Not on file   Social History Narrative  . No narrative on file    Family Status  Relation Status  . Mother Deceased   Breast Cancer  . Father Deceased at age 38   MI, CAD  . Sister Alive   healthy  . Maternal Grandmother Deceased   DM  . Daughter Alive   healthy  . Daughter Alive   healthy    Review of Systems Admits to some neck pain.  A complete 10 system ROS was obtained and was negative apart from what is mentioned.   Objective:   VITALS:   Vitals:   02/05/16 1048  BP: 112/70  Pulse: 78  Weight: 153 lb (69.4 kg)  Height: 5\' 3"  (1.6 m)   Gen:  Appears stated age and in NAD. HEENT:  Normocephalic, atraumatic. R conjunctiva is injected.  The mucous membranes are moist. The superficial temporal arteries are without ropiness or tenderness. Cardiovascular: Regular rate and rhythm. Lungs: Clear to auscultation bilaterally. Neck: There are no carotid bruits noted bilaterally.  NEUROLOGICAL: Orientation:  The patient is alert and oriented x 3.    Cranial nerves: There is good facial symmetry.  Soft palate rises symmetrically and there is no tongue deviation. Hearing is intact to conversational tone. Sensation: Sensation is intact to light touch throughout. Motor: Strength is 5/5 in the bilateral upper and lower extremities.  Shoulder shrug is equal bilaterally.  There is no pronator drift.  There are no fasciculations noted.  MOVEMENT EXAM: Tone: Tone is good throughout.  No rigidity. Abnormal  movements:  There is no postural tremor.  There is no rest tremor with distraction procedures.  No trouble with archimedes spirals Coordination:  The patient has slowing and decremation with finger taps, hand opening and closing, alternation of supination/pronation of the forearm on the right.  Heel taps/toe taps are good bilaterally.   Gait and Station: The patient arises out the chair without difficulty.  She just slightly shuffles although stride length is good.  She is mildly unbalanced.   There is good arm swing but there is right upper extremity tremor with ambulation.  There is a positive pull test.  Labs:   Lab Results  Component Value Date   VITAMINB12 473 04/20/2014      Assessment/Plan:   1.  Tremor.  -She has definitely become more parkinsonian but she still doesn't meet formal criteria for parkinsons disease.  I do think that this diagnosis is coming along.  She and I discussed this in detail.  Told her she needed to get back to formal exercise.    2.  Cerebral small vessel disease  -Relatively significant on her MRI of the brain.  She has a history of hypertension, hyperlipidemia, coronary artery disease and tobacco abuse hx.  She has quit smoking.  She will remain on aspirin.  Talked to her about risk factors associated with this degree of small vessel disease.  3.  PN, on examination with identified B12 deficiency  -Physical therapy has helped her balance in the past but she hasn't gone back to that.  Her daughters have encouraged her to get back to that and so did I.  -We'll continue B12 injections.  Offered to have her give at home but she doesn't wish to do  that.    4.  Follow-up in the next 4 months, sooner should new neurologic issues arise.  Greater than 50% of the 25 minute visit spent in counseling, as above.  Discussed safety in detail.

## 2016-02-05 ENCOUNTER — Ambulatory Visit (INDEPENDENT_AMBULATORY_CARE_PROVIDER_SITE_OTHER): Payer: Medicare HMO | Admitting: Neurology

## 2016-02-05 ENCOUNTER — Encounter: Payer: Self-pay | Admitting: Neurology

## 2016-02-05 VITALS — BP 112/70 | HR 78 | Ht 63.0 in | Wt 153.0 lb

## 2016-02-05 DIAGNOSIS — R251 Tremor, unspecified: Secondary | ICD-10-CM | POA: Diagnosis not present

## 2016-02-05 DIAGNOSIS — E538 Deficiency of other specified B group vitamins: Secondary | ICD-10-CM

## 2016-02-05 MED ORDER — CYANOCOBALAMIN 1000 MCG/ML IJ SOLN
1000.0000 ug | Freq: Once | INTRAMUSCULAR | Status: AC
  Administered 2016-02-05: 1000 ug via INTRAMUSCULAR

## 2016-02-08 ENCOUNTER — Ambulatory Visit: Payer: Medicare HMO

## 2016-02-12 ENCOUNTER — Ambulatory Visit: Payer: Medicare HMO

## 2016-02-28 DIAGNOSIS — M8580 Other specified disorders of bone density and structure, unspecified site: Secondary | ICD-10-CM | POA: Diagnosis not present

## 2016-02-28 DIAGNOSIS — I2510 Atherosclerotic heart disease of native coronary artery without angina pectoris: Secondary | ICD-10-CM | POA: Diagnosis not present

## 2016-02-28 DIAGNOSIS — Z0000 Encounter for general adult medical examination without abnormal findings: Secondary | ICD-10-CM | POA: Diagnosis not present

## 2016-02-28 DIAGNOSIS — I119 Hypertensive heart disease without heart failure: Secondary | ICD-10-CM | POA: Diagnosis not present

## 2016-02-28 DIAGNOSIS — E7800 Pure hypercholesterolemia, unspecified: Secondary | ICD-10-CM | POA: Diagnosis not present

## 2016-02-28 DIAGNOSIS — K219 Gastro-esophageal reflux disease without esophagitis: Secondary | ICD-10-CM | POA: Diagnosis not present

## 2016-02-28 DIAGNOSIS — G20 Parkinson's disease: Secondary | ICD-10-CM | POA: Diagnosis not present

## 2016-03-11 ENCOUNTER — Ambulatory Visit (INDEPENDENT_AMBULATORY_CARE_PROVIDER_SITE_OTHER): Payer: Medicare HMO | Admitting: Neurology

## 2016-03-11 DIAGNOSIS — E538 Deficiency of other specified B group vitamins: Secondary | ICD-10-CM

## 2016-03-11 MED ORDER — CYANOCOBALAMIN 1000 MCG/ML IJ SOLN
1000.0000 ug | Freq: Once | INTRAMUSCULAR | Status: AC
  Administered 2016-03-11: 1000 ug via INTRAMUSCULAR

## 2016-03-11 NOTE — Progress Notes (Signed)
B12 injection to left deltoid with no apparent complications.   

## 2016-04-08 ENCOUNTER — Ambulatory Visit (INDEPENDENT_AMBULATORY_CARE_PROVIDER_SITE_OTHER): Payer: Medicare HMO | Admitting: Neurology

## 2016-04-08 DIAGNOSIS — E538 Deficiency of other specified B group vitamins: Secondary | ICD-10-CM

## 2016-04-08 DIAGNOSIS — M8588 Other specified disorders of bone density and structure, other site: Secondary | ICD-10-CM | POA: Diagnosis not present

## 2016-04-08 MED ORDER — CYANOCOBALAMIN 1000 MCG/ML IJ SOLN
1000.0000 ug | Freq: Once | INTRAMUSCULAR | Status: AC
  Administered 2016-04-08: 1000 ug via INTRAMUSCULAR

## 2016-05-08 ENCOUNTER — Ambulatory Visit (INDEPENDENT_AMBULATORY_CARE_PROVIDER_SITE_OTHER): Payer: Medicare HMO | Admitting: Neurology

## 2016-05-08 DIAGNOSIS — E538 Deficiency of other specified B group vitamins: Secondary | ICD-10-CM | POA: Diagnosis not present

## 2016-05-08 MED ORDER — CYANOCOBALAMIN 1000 MCG/ML IJ SOLN
1000.0000 ug | Freq: Once | INTRAMUSCULAR | Status: AC
  Administered 2016-05-08: 1000 ug via INTRAMUSCULAR

## 2016-05-15 DIAGNOSIS — M6281 Muscle weakness (generalized): Secondary | ICD-10-CM | POA: Diagnosis not present

## 2016-05-15 DIAGNOSIS — M545 Low back pain: Secondary | ICD-10-CM | POA: Diagnosis not present

## 2016-05-15 DIAGNOSIS — M7532 Calcific tendinitis of left shoulder: Secondary | ICD-10-CM | POA: Diagnosis not present

## 2016-05-15 DIAGNOSIS — M5126 Other intervertebral disc displacement, lumbar region: Secondary | ICD-10-CM | POA: Diagnosis not present

## 2016-05-20 DIAGNOSIS — Z9849 Cataract extraction status, unspecified eye: Secondary | ICD-10-CM | POA: Diagnosis not present

## 2016-05-20 DIAGNOSIS — K219 Gastro-esophageal reflux disease without esophagitis: Secondary | ICD-10-CM | POA: Diagnosis not present

## 2016-05-20 DIAGNOSIS — Z79899 Other long term (current) drug therapy: Secondary | ICD-10-CM | POA: Diagnosis not present

## 2016-05-20 DIAGNOSIS — I10 Essential (primary) hypertension: Secondary | ICD-10-CM | POA: Diagnosis not present

## 2016-05-20 DIAGNOSIS — Z972 Presence of dental prosthetic device (complete) (partial): Secondary | ICD-10-CM | POA: Diagnosis not present

## 2016-05-20 DIAGNOSIS — Z6828 Body mass index (BMI) 28.0-28.9, adult: Secondary | ICD-10-CM | POA: Diagnosis not present

## 2016-05-20 DIAGNOSIS — Z7982 Long term (current) use of aspirin: Secondary | ICD-10-CM | POA: Diagnosis not present

## 2016-05-20 DIAGNOSIS — M810 Age-related osteoporosis without current pathological fracture: Secondary | ICD-10-CM | POA: Diagnosis not present

## 2016-05-20 DIAGNOSIS — E7800 Pure hypercholesterolemia, unspecified: Secondary | ICD-10-CM | POA: Diagnosis not present

## 2016-05-20 DIAGNOSIS — G20 Parkinson's disease: Secondary | ICD-10-CM | POA: Diagnosis not present

## 2016-05-20 DIAGNOSIS — K08109 Complete loss of teeth, unspecified cause, unspecified class: Secondary | ICD-10-CM | POA: Diagnosis not present

## 2016-05-20 DIAGNOSIS — Z0000 Encounter for general adult medical examination without abnormal findings: Secondary | ICD-10-CM | POA: Diagnosis not present

## 2016-05-22 DIAGNOSIS — Z1231 Encounter for screening mammogram for malignant neoplasm of breast: Secondary | ICD-10-CM | POA: Diagnosis not present

## 2016-06-02 NOTE — Progress Notes (Signed)
Subjective:   Jacqueline Flores was seen in consultation in the movement disorder clinic at the request of Dr. Laurann Montana.  Pts PCP is [name],[name], MD.  The evaluation is for tremor.  The patient is a 81 y.o. right handed female with a history of tremor.  Tremor just started in the R hand after a fall from a truck 3 weeks ago.  She was getting into her truck and literally fell out attempting to get in.  She fell on the L side of the body and L hip.  Did not hit the head, just the shoulder and ribs and buttocks;  A few days later, she noted tremor of the R hand (husband actually noted it first when it was at rest).  Not noted it when using it, but notes it when ambulating.    Tremor details: Affected by caffeine:  No.  (2-3 medium sized cups per day) Affected by alcohol: unknown Affected by stress:  No. Affected by fatigue:  Yes.   Spills soup if on spoon:  No. Spills glass of liquid if full:  No. Affects ADL's (tying shoes, brushing teeth, etc):  No.  04/04/14 update:  The patient is following up today, earlier than expected.  She is accompanied by her daughter who supplements the history.  She thinks that tremor has increased on the right.  She did have an MRI of the brain since last visit that revealed  atrophy and relatively severe small vessel disease.  She is already on ASA.  She had lab workup since last visit for reversible causes of peripheral neuropathy.  Her B12 was quite low at 199.  She has been started on injections and has been receiving them faithfully.  Her RPR was negative, TSH was normal, and there was no M spike in the serum protein electrophoresis.  There is no monoclonal proteins and urine protein electrophoresis.    She is c/o dry eye and has been to the eye doctor.  She is using drops and a gel in her eyes.  No diplopia.  She has had no falls since last visit, but she has "stumbled some."  Daughter states that she hears the patient "shuffle more."  Daughter states that the  biggest issue is her anxiety/depression as she will cry when she has days of tremor.  She and her daughter talk about her "parkinsons."  10/05/14 update:  The patient is following up today, accompanied by her daughter (a different one than came last visit) who supplements the history.  She attended physical therapy for gait and balance since our last visit.  She states that has really helped and she can even get out of the bathtub now.  She has been faithfully receiving her B12 injections here at the office.  She had a repeat B12 level that was a trough level and it was 473.  In regards to her tremor, it has been about the same.  She has had no falls since our last visit.  No lightheadedness or near syncope.  No hallucinations.  04/04/15 update:  The patient follows up today, accompanied by her daughter who supplements the history.  She has a history of B12 deficiency and was receiving B12 injections faithfully last year, but has not come this year for injections.  She states that she had bronchitis and just didn't want to come in for injections; she requests one today.  In regards to tremor, the patient states that it is about the same.  She denies  any falls since our last visit.  She denies lightheadedness or near syncope.  She does have a history of rather significant cerebral small vessel disease and was occasionally smoking last visit, and I encouraged her to discontinue that altogether.  She has done that.  She is still taking her aspirin faithfully.  She is exercising faithfully and states that she can get in and out of the bathtub and didn't used to be able to do that.  She is really pleased.    10/05/15 update:   She reports doing well over the last 6 months.  She fell one time down basement stairs because her knee gave out and she broke 3 toes.  She also [place] stair at beach and thought she was at bottom stair and she wasn't.  She didn't get hurt that time.    No hallucinations.  No lightheadedness or  near syncope.  She is exercising.  She is receiving monthly B12 injections for her B12 deficiency, the last of which was 09/14/15.  She still isn't smoking.  Isn't exercising as faithful.    02/05/16 update:  Patient follows up today.  She has been receiving B12 injections for B12 deficiency.  Overall, the patient thinks that she has been stable.  Pt denies falls.  Pt denies lightheadedness, near syncope.  No hallucinations.  Mood has been good.  She hasn't been doing as much exercise.    06/04/16 update: Patient seen today in follow-up.  This patient is accompanied in the office by her daughter who supplements the history.   The patient continues to have some tremor.  She does not think that has progressed significantly.  She has not had any falls.  She has not been exercising.  She states that her mood has been good.  She has been receiving her injections for B12 deficiency.   Outside reports reviewed: historical medical records, lab reports and office notes.  Allergies  Allergen Reactions  . Zetia [Ezetimibe]     Swelling   . Sulfa Antibiotics Rash    Outpatient Encounter Prescriptions as of 06/04/2016  Medication Sig  . aspirin 81 MG tablet Take 81 mg by mouth daily.  . Calcium Carbonate (CALCIUM 600) 1500 MG TABS Take by mouth.  . hydrochlorothiazide (HYDRODIURIL) 25 MG tablet Take 25 mg by mouth as needed.  . moexipril (UNIVASC) 7.5 MG tablet Take 7.5 mg by mouth at bedtime.  Marland Kitchen NIFEdipine (PROCARDIA-XL/ADALAT CC) 30 MG 24 hr tablet Take 30 mg by mouth daily.  . Omega-3 Fatty Acids (FISH OIL) 1000 MG CAPS Take 1 capsule by mouth 3 (three) times daily.  Marland Kitchen omeprazole (PRILOSEC) 40 MG capsule Take 40 mg by mouth daily.  . pravastatin (PRAVACHOL) 80 MG tablet Take 80 mg by mouth daily.  . [EXPIRED] cyanocobalamin ((VITAMIN B-12)) injection 1,000 mcg    No facility-administered encounter medications on file as of 06/04/2016.     Past Medical History:  Diagnosis Date  . Coronary artery  disease    post MI  . Esophageal reflux   . Hematuria   . Hyperlipidemia   . Hypertension   . MI (myocardial infarction) ([place])    age 29  . Osteoarthritis   . Osteopenia     Past Surgical History:  Procedure Laterality Date  . CATARACT EXTRACTION W/ INTRAOCULAR LENS  IMPLANT, BILATERAL    . DILATION AND CURETTAGE OF UTERUS    . lid lift     bilaterally, lid lag  . NEPHROLITHOTOMY  Social History   Social History  . Marital status: Married    Spouse name: N/A  . Number of children: N/A  . Years of education: N/A   Occupational History  .  Retired    [employer]   Social History Main Topics  . Smoking status: Former Smoker    Years: 30.00    Quit date: 10/28/2010  . Smokeless tobacco: Never Used  . Alcohol use Yes     Comment: glass of wine once a month  . Drug use: No  . Sexual activity: Not on file   Other Topics Concern  . Not on file   Social History Narrative  . No narrative on file    Family Status  Relation Status  . Mother Deceased   Breast Cancer  . Father Deceased at age 81   MI, CAD  . Sister Alive   healthy  . Maternal Grandmother Deceased   DM  . Daughter Alive   healthy  . Daughter Alive   healthy    Review of Systems Admits to some neck pain.  A complete 10 system ROS was obtained and was negative apart from what is mentioned.   Objective:   VITALS:   Vitals:   06/04/16 1009  BP: 110/68  Pulse: 76  SpO2: 94%  Weight: 156 lb (70.8 kg)  Height: 5\' 3"  (1.6 m)   Gen:  Appears stated age and in NAD. HEENT:  Normocephalic, atraumatic. The mucous membranes are moist. The superficial temporal arteries are without ropiness or tenderness. Cardiovascular: Regular rate and rhythm. Lungs: Clear to auscultation bilaterally. Neck: There are no carotid bruits noted bilaterally.  NEUROLOGICAL: Orientation:  The patient is alert and oriented x 3.    Cranial nerves: There is good facial symmetry.  Soft palate rises  symmetrically and there is no tongue deviation. Hearing is intact to conversational tone. Sensation: Sensation is intact to light touch throughout. Motor: Strength is 5/5 in the bilateral upper and lower extremities.  Shoulder shrug is equal bilaterally.  There is no pronator drift.  There are no fasciculations noted.  MOVEMENT EXAM: Tone: Mild rigidity in the RUE Abnormal movements:  There is no postural tremor.  There is no rest tremor with distraction procedures.  No trouble with archimedes spirals.  Does have RUE resting tremor with ambulation and more mild LUE resting tremor with ambulation Coordination:  The patient has slowing and decremation with finger taps, hand opening and closing, alternation of supination/pronation of the forearm on the right and L.   Heel taps/toe taps are slow on the L. Gait and Station: The patient arises out the chair without difficulty.  She just slightly shuffles although stride length is good.  She is mildly unbalanced.   There is good arm swing but there is right upper extremity tremor with ambulation and mild LUE tremor as well. There is a positive pull test.  Labs:   Lab Results  Component Value Date   VITAMINB12 473 04/20/2014      Assessment/Plan:   1.  Parkinsons Disease  -Pt has had tremor for long time but is just now (06/2016) meeting criteria for PD  -We discussed the diagnosis as well as pathophysiology of the disease.  We discussed treatment options as well as prognostic indicators.  Patient education was provided.  -We discussed that it used to be thought that levodopa would increase risk of melanoma but now it is believed that Parkinsons itself likely increases risk of melanoma. she is  to get regular skin checks.  -Pt wants to hold on meds for now  -We discussed community resources in the area including patient support groups and community exercise programs for PD and pt education was provided to the patient.  -refer to PT in [place]   2.   Cerebral small vessel disease  -Relatively significant on her MRI of the brain.  She has a history of hypertension, hyperlipidemia, coronary artery disease and tobacco abuse hx.  She has quit smoking.  She will remain on aspirin.  Talked to her about risk factors associated with this degree of small vessel disease.  3.  PN, on examination with identified B12 deficiency  -We'll continue B12 injections.  Offered to have her give at home but she doesn't wish to do that.    4.  Follow-up in the next 3 months, sooner should new neurologic issues arise.  Greater than 50% of the 40 minute visit spent in counseling, as above.  Discussed safety in detail.

## 2016-06-04 ENCOUNTER — Ambulatory Visit (INDEPENDENT_AMBULATORY_CARE_PROVIDER_SITE_OTHER): Payer: Medicare HMO | Admitting: Neurology

## 2016-06-04 ENCOUNTER — Encounter: Payer: Self-pay | Admitting: Neurology

## 2016-06-04 VITALS — BP 110/68 | HR 76 | Ht 63.0 in | Wt 156.0 lb

## 2016-06-04 DIAGNOSIS — R251 Tremor, unspecified: Secondary | ICD-10-CM

## 2016-06-04 DIAGNOSIS — E538 Deficiency of other specified B group vitamins: Secondary | ICD-10-CM | POA: Diagnosis not present

## 2016-06-04 DIAGNOSIS — G20C Parkinsonism, unspecified: Secondary | ICD-10-CM

## 2016-06-04 DIAGNOSIS — G20 Parkinson's disease: Secondary | ICD-10-CM

## 2016-06-04 MED ORDER — CYANOCOBALAMIN 1000 MCG/ML IJ SOLN
1000.0000 ug | Freq: Once | INTRAMUSCULAR | Status: AC
  Administered 2016-06-04: 1000 ug via INTRAMUSCULAR

## 2016-06-10 ENCOUNTER — Ambulatory Visit: Payer: Medicare HMO

## 2016-06-15 DIAGNOSIS — T148XXA Other injury of unspecified body region, initial encounter: Secondary | ICD-10-CM | POA: Diagnosis not present

## 2016-07-08 ENCOUNTER — Ambulatory Visit (INDEPENDENT_AMBULATORY_CARE_PROVIDER_SITE_OTHER): Payer: Medicare HMO

## 2016-07-08 DIAGNOSIS — E538 Deficiency of other specified B group vitamins: Secondary | ICD-10-CM | POA: Diagnosis not present

## 2016-07-08 MED ORDER — CYANOCOBALAMIN 1000 MCG/ML IJ SOLN
1000.0000 ug | Freq: Once | INTRAMUSCULAR | Status: AC
  Administered 2016-07-08: 1000 ug via INTRAMUSCULAR

## 2016-08-07 ENCOUNTER — Ambulatory Visit (INDEPENDENT_AMBULATORY_CARE_PROVIDER_SITE_OTHER): Payer: Medicare HMO | Admitting: Neurology

## 2016-08-07 DIAGNOSIS — E538 Deficiency of other specified B group vitamins: Secondary | ICD-10-CM

## 2016-08-07 MED ORDER — CYANOCOBALAMIN 1000 MCG/ML IJ SOLN
1000.0000 ug | Freq: Once | INTRAMUSCULAR | Status: AC
  Administered 2016-08-07: 1000 ug via INTRAMUSCULAR

## 2016-08-07 NOTE — Progress Notes (Signed)
B12 injection to left deltoid with no apparent complications.   

## 2016-08-22 DIAGNOSIS — I8312 Varicose veins of left lower extremity with inflammation: Secondary | ICD-10-CM | POA: Diagnosis not present

## 2016-08-22 DIAGNOSIS — D2261 Melanocytic nevi of right upper limb, including shoulder: Secondary | ICD-10-CM | POA: Diagnosis not present

## 2016-08-22 DIAGNOSIS — I872 Venous insufficiency (chronic) (peripheral): Secondary | ICD-10-CM | POA: Diagnosis not present

## 2016-08-22 DIAGNOSIS — L821 Other seborrheic keratosis: Secondary | ICD-10-CM | POA: Diagnosis not present

## 2016-08-22 DIAGNOSIS — L814 Other melanin hyperpigmentation: Secondary | ICD-10-CM | POA: Diagnosis not present

## 2016-08-22 DIAGNOSIS — D1801 Hemangioma of skin and subcutaneous tissue: Secondary | ICD-10-CM | POA: Diagnosis not present

## 2016-08-22 DIAGNOSIS — I8311 Varicose veins of right lower extremity with inflammation: Secondary | ICD-10-CM | POA: Diagnosis not present

## 2016-09-09 ENCOUNTER — Ambulatory Visit (INDEPENDENT_AMBULATORY_CARE_PROVIDER_SITE_OTHER): Payer: Medicare HMO | Admitting: Neurology

## 2016-09-09 DIAGNOSIS — E538 Deficiency of other specified B group vitamins: Secondary | ICD-10-CM | POA: Diagnosis not present

## 2016-09-09 MED ORDER — CYANOCOBALAMIN 1000 MCG/ML IJ SOLN
1000.0000 ug | Freq: Once | INTRAMUSCULAR | Status: AC
  Administered 2016-09-09: 1000 ug via INTRAMUSCULAR

## 2016-09-09 NOTE — Progress Notes (Signed)
B12 injection to left deltoid with no apparent complications.   

## 2016-09-10 ENCOUNTER — Telehealth: Payer: Self-pay | Admitting: Neurology

## 2016-09-10 NOTE — Telephone Encounter (Signed)
Noted. Daughter on DPR.

## 2016-09-10 NOTE — Telephone Encounter (Signed)
PT's daughter called and said no one will coming with her to the appointment with Dr Tat and she asks that if their is something she needs to know or if there is any changes can some one please call her daughter and let her know because she said her mom is very forgetful and will not tell her everything

## 2016-09-23 NOTE — Progress Notes (Signed)
Subjective:   Jacqueline Flores was seen in consultation in the movement disorder clinic at the request of Dr. Laurann Montana.  Pts PCP is Mayra Neer, MD.  The evaluation is for tremor.  The patient is a 81 y.o. right handed female with a history of tremor.  Tremor just started in the R hand after a fall from a truck 3 weeks ago.  She was getting into her truck and literally fell out attempting to get in.  She fell on the L side of the body and L hip.  Did not hit the head, just the shoulder and ribs and buttocks;  A few days later, she noted tremor of the R hand (husband actually noted it first when it was at rest).  Not noted it when using it, but notes it when ambulating.    Tremor details: Affected by caffeine:  No.  (2-3 medium sized cups per day) Affected by alcohol: unknown Affected by stress:  No. Affected by fatigue:  Yes.   Spills soup if on spoon:  No. Spills glass of liquid if full:  No. Affects ADL's (tying shoes, brushing teeth, etc):  No.  04/04/14 update:  The patient is following up today, earlier than expected.  She is accompanied by her daughter who supplements the history.  She thinks that tremor has increased on the right.  She did have an MRI of the brain since last visit that revealed  atrophy and relatively severe small vessel disease.  She is already on ASA.  She had lab workup since last visit for reversible causes of peripheral neuropathy.  Her B12 was quite low at 199.  She has been started on injections and has been receiving them faithfully.  Her RPR was negative, TSH was normal, and there was no M spike in the serum protein electrophoresis.  There is no monoclonal proteins and urine protein electrophoresis.    She is c/o dry eye and has been to the eye doctor.  She is using drops and a gel in her eyes.  No diplopia.  She has had no falls since last visit, but she has "stumbled some."  Daughter states that she hears the patient "shuffle more."  Daughter states that the  biggest issue is her anxiety/depression as she will cry when she has days of tremor.  She and her daughter talk about her "parkinsons."  10/05/14 update:  The patient is following up today, accompanied by her daughter (a different one than came last visit) who supplements the history.  She attended physical therapy for gait and balance since our last visit.  She states that has really helped and she can even get out of the bathtub now.  She has been faithfully receiving her B12 injections here at the office.  She had a repeat B12 level that was a trough level and it was 473.  In regards to her tremor, it has been about the same.  She has had no falls since our last visit.  No lightheadedness or near syncope.  No hallucinations.  04/04/15 update:  The patient follows up today, accompanied by her daughter who supplements the history.  She has a history of B12 deficiency and was receiving B12 injections faithfully last year, but has not come this year for injections.  She states that she had bronchitis and just didn't want to come in for injections; she requests one today.  In regards to tremor, the patient states that it is about the same.  She  denies any falls since our last visit.  She denies lightheadedness or near syncope.  She does have a history of rather significant cerebral small vessel disease and was occasionally smoking last visit, and I encouraged her to discontinue that altogether.  She has done that.  She is still taking her aspirin faithfully.  She is exercising faithfully and states that she can get in and out of the bathtub and didn't used to be able to do that.  She is really pleased.    10/05/15 update:   She reports doing well over the last 6 months.  She fell one time down basement stairs because her knee gave out and she broke 3 toes.  She also [place] stair at beach and thought she was at bottom stair and she wasn't.  She didn't get hurt that time.    No hallucinations.  No lightheadedness or  near syncope.  She is exercising.  She is receiving monthly B12 injections for her B12 deficiency, the last of which was 09/14/15.  She still isn't smoking.  Isn't exercising as faithful.    02/05/16 update:  Patient follows up today.  She has been receiving B12 injections for B12 deficiency.  Overall, the patient thinks that she has been stable.  Pt denies falls.  Pt denies lightheadedness, near syncope.  No hallucinations.  Mood has been good.  She hasn't been doing as much exercise.    06/04/16 update: Patient seen today in follow-up.  This patient is accompanied in the office by her daughter who supplements the history.   The patient continues to have some tremor.  She does not think that has progressed significantly.  She has not had any falls.  She has not been exercising.  She states that her mood has been good.  She has been receiving her injections for B12 deficiency.  09/25/16 update:  Pt f/u today for PD.  Has been faithfully receiving her B12 injections.  Is not on any medication currently for PD.  States that she has been doing well.   The records that were made available to me were reviewed since last visit.    Falls:   No. (going PT 3 times per week) N/V:  No. Hallucinations:  No.  visual distortions: No. Lightheaded:  No.  Syncope: No. Dyskinesia:  No.  Outside reports reviewed: historical medical records, lab reports and office notes.  Allergies  Allergen Reactions  . Zetia [Ezetimibe]     Swelling   . Sulfa Antibiotics Rash    Outpatient Encounter Prescriptions as of 09/25/2016  Medication Sig  . aspirin 81 MG tablet Take 81 mg by mouth daily.  . Calcium Carbonate (CALCIUM 600) 1500 MG TABS Take by mouth.  . hydrochlorothiazide (HYDRODIURIL) 25 MG tablet Take 25 mg by mouth as needed.  . moexipril (UNIVASC) 7.5 MG tablet Take 7.5 mg by mouth at bedtime.  Marland Kitchen NIFEdipine (PROCARDIA-XL/ADALAT CC) 30 MG 24 hr tablet Take 30 mg by mouth daily.  . Omega-3 Fatty Acids (FISH OIL)  1000 MG CAPS Take 1 capsule by mouth 3 (three) times daily.  Marland Kitchen omeprazole (PRILOSEC) 40 MG capsule Take 40 mg by mouth daily.  . pravastatin (PRAVACHOL) 80 MG tablet Take 80 mg by mouth daily.   No facility-administered encounter medications on file as of 09/25/2016.     Past Medical History:  Diagnosis Date  . Coronary artery disease    post MI  . Esophageal reflux   . Hematuria   . Hyperlipidemia   .  Hypertension   . MI (myocardial infarction) ([place])    age 73  . Osteoarthritis   . Osteopenia     Past Surgical History:  Procedure Laterality Date  . CATARACT EXTRACTION W/ INTRAOCULAR LENS  IMPLANT, BILATERAL    . DILATION AND CURETTAGE OF UTERUS    . lid lift     bilaterally, lid lag  . NEPHROLITHOTOMY      Social History   Social History  . Marital status: Married    Spouse name: N/A  . Number of children: N/A  . Years of education: N/A   Occupational History  .  Retired    [employer]   Social History Main Topics  . Smoking status: Former Smoker    Years: 30.00    Quit date: 10/28/2010  . Smokeless tobacco: Never Used  . Alcohol use Yes     Comment: glass of wine once a month  . Drug use: No  . Sexual activity: Not on file   Other Topics Concern  . Not on file   Social History Narrative  . No narrative on file    Family Status  Relation Status  . Mother Deceased       Breast Cancer  . Father Deceased at age 89       MI, CAD  . Sister Alive       healthy  . MGM Deceased       DM  . Daughter Alive       healthy  . Daughter Alive       healthy    Review of Systems Admits to some neck pain.  A complete 10 system ROS was obtained and was negative apart from what is mentioned.   Objective:   VITALS:   Vitals:   09/25/16 1017  BP: 110/60  Pulse: 64  SpO2: 93%  Weight: 154 lb (69.9 kg)  Height: 5\' 4"  (1.626 m)   Gen:  Appears stated age and in NAD. HEENT:  Normocephalic, atraumatic. The mucous membranes are moist. The  superficial temporal arteries are without ropiness or tenderness. Cardiovascular: Regular rate and rhythm. Lungs: Clear to auscultation bilaterally. Neck: There are no carotid bruits noted bilaterally.  NEUROLOGICAL: Orientation:  The patient is alert and oriented x 3.    Cranial nerves: There is good facial symmetry.  Soft palate rises symmetrically and there is no tongue deviation. Hearing is intact to conversational tone. Sensation: Sensation is intact to light touch throughout. Motor: Strength is 5/5 in the bilateral upper and lower extremities.  Shoulder shrug is equal bilaterally.  There is no pronator drift.  There are no fasciculations noted.  MOVEMENT EXAM: Tone: mild rigidity in the bilateral UE Abnormal movements:  There is no postural tremor.  There is bilateral UE resting tremor with ambulation, R more than L Coordination:  There is decremation, with any form of RAMS, including alternating supination and pronation of the forearm, hand opening and closing, finger taps, heel taps and toe taps bilaterally.  The UE are worse than the LE.  Gait and Station: Pt arises OOC without trouble.  She has re-emergent tremor in the UE bilaterally.  She has good stride length but the longer she walks the more she has some heel shuffle.      Labs:   Lab Results  Component Value Date   [id number] 256 04/20/2014      Assessment/Plan:   1.  Parkinsons Disease, dx formally in 06/2016  -Pt has had tremor for  long time but is just now (06/2016) meeting criteria for PD  -talked to her again about starting medication but she really doesn't want to.  Told her that I may start that next visit and she is going to think about that over the next few months  -she will continue with PT and encouraged her to find exercise program as well   2.  Cerebral small vessel disease  -Relatively significant on her MRI of the brain.  She has a history of hypertension, hyperlipidemia, coronary artery disease and  tobacco abuse hx.  She has quit smoking.  She will remain on aspirin.  Talked to her about risk factors associated with this degree of small vessel disease.  3.  PN, on examination with identified B12 deficiency  -We'll continue B12 injections.  Offered to have her give at home but she doesn't wish to do that.    4.  Follow up is anticipated in the next few months, sooner should new neurologic issues arise.  Much greater than 50% of this visit was spent in counseling and coordinating care.  Total face to face time:  25 min

## 2016-09-25 ENCOUNTER — Encounter: Payer: Self-pay | Admitting: Neurology

## 2016-09-25 ENCOUNTER — Ambulatory Visit (INDEPENDENT_AMBULATORY_CARE_PROVIDER_SITE_OTHER): Payer: Medicare HMO | Admitting: Neurology

## 2016-09-25 VITALS — BP 110/60 | HR 64 | Ht 64.0 in | Wt 154.0 lb

## 2016-09-25 DIAGNOSIS — E538 Deficiency of other specified B group vitamins: Secondary | ICD-10-CM | POA: Diagnosis not present

## 2016-09-25 DIAGNOSIS — G20 Parkinson's disease: Secondary | ICD-10-CM

## 2016-10-14 ENCOUNTER — Ambulatory Visit (INDEPENDENT_AMBULATORY_CARE_PROVIDER_SITE_OTHER): Payer: Medicare HMO

## 2016-10-14 DIAGNOSIS — E538 Deficiency of other specified B group vitamins: Secondary | ICD-10-CM | POA: Diagnosis not present

## 2016-10-14 MED ORDER — CYANOCOBALAMIN 1000 MCG/ML IJ SOLN
1000.0000 ug | Freq: Once | INTRAMUSCULAR | Status: AC
  Administered 2016-10-14: 1000 ug via INTRAMUSCULAR

## 2016-11-13 ENCOUNTER — Ambulatory Visit: Payer: Medicare HMO

## 2016-11-14 ENCOUNTER — Ambulatory Visit (INDEPENDENT_AMBULATORY_CARE_PROVIDER_SITE_OTHER): Payer: Medicare HMO | Admitting: Neurology

## 2016-11-14 DIAGNOSIS — E538 Deficiency of other specified B group vitamins: Secondary | ICD-10-CM | POA: Diagnosis not present

## 2016-11-14 MED ORDER — CYANOCOBALAMIN 1000 MCG/ML IJ SOLN
1000.0000 ug | Freq: Once | INTRAMUSCULAR | Status: AC
  Administered 2016-11-14: 1000 ug via INTRAMUSCULAR

## 2016-12-15 ENCOUNTER — Ambulatory Visit: Payer: Medicare HMO

## 2016-12-16 ENCOUNTER — Ambulatory Visit (INDEPENDENT_AMBULATORY_CARE_PROVIDER_SITE_OTHER): Payer: Medicare HMO | Admitting: Neurology

## 2016-12-16 DIAGNOSIS — E538 Deficiency of other specified B group vitamins: Secondary | ICD-10-CM

## 2016-12-16 MED ORDER — CYANOCOBALAMIN 1000 MCG/ML IJ SOLN
1000.0000 ug | Freq: Once | INTRAMUSCULAR | Status: AC
  Administered 2016-12-16: 1000 ug via INTRAMUSCULAR

## 2016-12-16 NOTE — Progress Notes (Signed)
B12 injection to left deltoid with no apparent complications.   

## 2017-01-14 DIAGNOSIS — H04123 Dry eye syndrome of bilateral lacrimal glands: Secondary | ICD-10-CM | POA: Diagnosis not present

## 2017-01-14 DIAGNOSIS — Z961 Presence of intraocular lens: Secondary | ICD-10-CM | POA: Diagnosis not present

## 2017-01-15 ENCOUNTER — Ambulatory Visit (INDEPENDENT_AMBULATORY_CARE_PROVIDER_SITE_OTHER): Payer: Medicare HMO | Admitting: Neurology

## 2017-01-15 DIAGNOSIS — E538 Deficiency of other specified B group vitamins: Secondary | ICD-10-CM

## 2017-01-15 MED ORDER — CYANOCOBALAMIN 1000 MCG/ML IJ SOLN
1000.0000 ug | Freq: Once | INTRAMUSCULAR | Status: AC
  Administered 2017-01-15: 1000 ug via INTRAMUSCULAR

## 2017-01-15 NOTE — Progress Notes (Signed)
B12 injection to left deltoid with no apparent complications.   

## 2017-02-11 NOTE — Progress Notes (Signed)
Subjective:   Jacqueline Flores was seen in consultation in the movement disorder clinic at the request of Dr. Laurann Montana.  Pts PCP is Mayra Neer, MD.  The evaluation is for tremor.  The patient is a 82 y.o. right handed female with a history of tremor.  Tremor just started in the R hand after a fall from a truck 3 weeks ago.  She was getting into her truck and literally fell out attempting to get in.  She fell on the L side of the body and L hip.  Did not hit the head, just the shoulder and ribs and buttocks;  A few days later, she noted tremor of the R hand (husband actually noted it first when it was at rest).  Not noted it when using it, but notes it when ambulating.    Tremor details: Affected by caffeine:  No.  (2-3 medium sized cups per day) Affected by alcohol: unknown Affected by stress:  No. Affected by fatigue:  Yes.   Spills soup if on spoon:  No. Spills glass of liquid if full:  No. Affects ADL's (tying shoes, brushing teeth, etc):  No.  04/04/14 update:  The patient is following up today, earlier than expected.  She is accompanied by her daughter who supplements the history.  She thinks that tremor has increased on the right.  She did have an MRI of the brain since last visit that revealed  atrophy and relatively severe small vessel disease.  She is already on ASA.  She had lab workup since last visit for reversible causes of peripheral neuropathy.  Her B12 was quite low at 199.  She has been started on injections and has been receiving them faithfully.  Her RPR was negative, TSH was normal, and there was no M spike in the serum protein electrophoresis.  There is no monoclonal proteins and urine protein electrophoresis.    She is c/o dry eye and has been to the eye doctor.  She is using drops and a gel in her eyes.  No diplopia.  She has had no falls since last visit, but she has "stumbled some."  Daughter states that she hears the patient "shuffle more."  Daughter states that the  biggest issue is her anxiety/depression as she will cry when she has days of tremor.  She and her daughter talk about her "parkinsons."  10/05/14 update:  The patient is following up today, accompanied by her daughter (a different one than came last visit) who supplements the history.  She attended physical therapy for gait and balance since our last visit.  She states that has really helped and she can even get out of the bathtub now.  She has been faithfully receiving her B12 injections here at the office.  She had a repeat B12 level that was a trough level and it was 473.  In regards to her tremor, it has been about the same.  She has had no falls since our last visit.  No lightheadedness or near syncope.  No hallucinations.  04/04/15 update:  The patient follows up today, accompanied by her daughter who supplements the history.  She has a history of B12 deficiency and was receiving B12 injections faithfully last year, but has not come this year for injections.  She states that she had bronchitis and just didn't want to come in for injections; she requests one today.  In regards to tremor, the patient states that it is about the same.  She  denies any falls since our last visit.  She denies lightheadedness or near syncope.  She does have a history of rather significant cerebral small vessel disease and was occasionally smoking last visit, and I encouraged her to discontinue that altogether.  She has done that.  She is still taking her aspirin faithfully.  She is exercising faithfully and states that she can get in and out of the bathtub and didn't used to be able to do that.  She is really pleased.    10/05/15 update:   She reports doing well over the last 6 months.  She fell one time down basement stairs because her knee gave out and she broke 3 toes.  She also [place] stair at beach and thought she was at bottom stair and she wasn't.  She didn't get hurt that time.    No hallucinations.  No lightheadedness or  near syncope.  She is exercising.  She is receiving monthly B12 injections for her B12 deficiency, the last of which was 09/14/15.  She still isn't smoking.  Isn't exercising as faithful.    02/05/16 update:  Patient follows up today.  She has been receiving B12 injections for B12 deficiency.  Overall, the patient thinks that she has been stable.  Pt denies falls.  Pt denies lightheadedness, near syncope.  No hallucinations.  Mood has been good.  She hasn't been doing as much exercise.    06/04/16 update: Patient seen today in follow-up.  This patient is accompanied in the office by her daughter who supplements the history.   The patient continues to have some tremor.  She does not think that has progressed significantly.  She has not had any falls.  She has not been exercising.  She states that her mood has been good.  She has been receiving her injections for B12 deficiency.  09/25/16 update:  Pt f/u today for PD.  Has been faithfully receiving her B12 injections.  Is not on any medication currently for PD.  States that she has been doing well.   The records that were made available to me were reviewed since last visit.    Falls:   No. (going PT 3 times per week) N/V:  No. Hallucinations:  No.  visual distortions: No. Lightheaded:  No.  Syncope: No. Dyskinesia:  No.   02/13/17 update: Patient is seen today in follow-up for Parkinson's disease.  She is currently on no medication, by her choice.  She reports that she is doing well.  She fell 12/27 going up the stairs and has a place on her leg.  She denies any lightheadedness or near syncope.  No hallucinations.  No new medical issues.  I have reviewed records available to me since last visit.  She did get out of the habit of exercise  Outside reports reviewed: historical medical records, lab reports and office notes.  Allergies  Allergen Reactions  . Zetia [Ezetimibe]     Swelling   . Sulfa Antibiotics Rash    Outpatient Encounter Medications  as of 02/13/2017  Medication Sig  . aspirin 81 MG tablet Take 81 mg by mouth daily.  . Calcium Carbonate (CALCIUM 600) 1500 MG TABS Take by mouth.  . hydrochlorothiazide (HYDRODIURIL) 25 MG tablet Take 25 mg by mouth as needed.  . moexipril (UNIVASC) 7.5 MG tablet Take 7.5 mg by mouth at bedtime.  Marland Kitchen NIFEdipine (PROCARDIA-XL/ADALAT CC) 30 MG 24 hr tablet Take 30 mg by mouth daily.  . Omega-3 Fatty Acids (  FISH OIL) 1000 MG CAPS Take 1 capsule by mouth 3 (three) times daily.  Marland Kitchen omeprazole (PRILOSEC) 40 MG capsule Take 40 mg by mouth daily.  . pravastatin (PRAVACHOL) 80 MG tablet Take 80 mg by mouth daily.   No facility-administered encounter medications on file as of 02/13/2017.     Past Medical History:  Diagnosis Date  . Coronary artery disease    post MI  . Esophageal reflux   . Hematuria   . Hyperlipidemia   . Hypertension   . MI (myocardial infarction) ([place])    age 38  . Osteoarthritis   . Osteopenia     Past Surgical History:  Procedure Laterality Date  . CATARACT EXTRACTION W/ INTRAOCULAR LENS  IMPLANT, BILATERAL    . DILATION AND CURETTAGE OF UTERUS    . lid lift     bilaterally, lid lag  . NEPHROLITHOTOMY      Social History   Socioeconomic History  . Marital status: Married    Spouse name: Not on file  . Number of children: Not on file  . Years of education: Not on file  . Highest education level: Not on file  Social Needs  . Financial resource strain: Not on file  . Food insecurity - worry: Not on file  . Food insecurity - inability: Not on file  . Transportation needs - medical: Not on file  . Transportation needs - non-medical: Not on file  Occupational History    Employer: RETIRED    Comment: [employer]  Tobacco Use  . Smoking status: Former Smoker    Years: 30.00    Last attempt to quit: 10/28/2010    Years since quitting: 6.2  . Smokeless tobacco: Never Used  Substance and Sexual Activity  . Alcohol use: Yes    Comment: glass of wine  once a month  . Drug use: No  . Sexual activity: Not on file  Other Topics Concern  . Not on file  Social History Narrative  . Not on file    Family Status  Relation Name Status  . Mother  Deceased       Breast Cancer  . Father  Deceased at age 65       MI, CAD  . Sister ##Sister1 Alive       healthy  . MGM  Deceased       DM  . Daughter  Alive       healthy  . Daughter  Alive       healthy    Review of Systems Admits to some neck pain.  A complete 10 system ROS was obtained and was negative apart from what is mentioned.   Objective:   VITALS:   There were no vitals filed for this visit. Gen:  Appears stated age and in NAD. HEENT:  Normocephalic, atraumatic. The mucous membranes are moist. The superficial temporal arteries are without ropiness or tenderness. Cardiovascular: Regular rate and rhythm. Lungs: Clear to auscultation bilaterally. Neck: There are no carotid bruits noted bilaterally. Skin:  She has a wound on her leg with surrounding erythema.  NEUROLOGICAL: Orientation:  The patient is alert and oriented x 3.    Cranial nerves: There is good facial symmetry.  Soft palate rises symmetrically and there is no tongue deviation. Hearing is intact to conversational tone. Sensation: Sensation is intact to light touch throughout. Motor: Strength is 5/5 in the bilateral upper and lower extremities.  Shoulder shrug is equal bilaterally.  There is no  pronator drift.  There are no fasciculations noted.  MOVEMENT EXAM: Tone: no rigidity today Abnormal movements:  There is no postural tremor.  There is bilateral UE resting tremor with ambulation, R more than L Coordination:  There is decremation, with any form of RAMS, including alternating supination and pronation of the forearm, hand opening and closing, finger taps, heel taps and toe taps bilaterally.  The UE are worse than the LE.  The R is worse than the L Gait and Station: Pt arises OOC without trouble.  She has  re-emergent tremor in the UE bilaterally.  She is shuffling more than in the past.    Labs:   Lab Results  Component Value Date   VITAMINB12 473 04/20/2014      Assessment/Plan:   1.  Parkinsons Disease, dx formally in 06/2016  -Pt has had tremor for long time but is just now (06/2016) meeting criteria for PD  -start carbidopa/levodopa 25/100 and work to 1 po tid.  Risks, benefits, side effects and alternative therapies were discussed.  The opportunity to ask questions was given and they were answered to the best of my ability.  The patient expressed understanding and willingness to follow the outlined treatment protocols.  -needs to get back to get back to exercise  -was going to have patient meet with my PD social worker but her PCP graciously agreed to see patient immediately so I sent her downstairs   2.  Cerebral small vessel disease  -Relatively significant on her MRI of the brain.  She has a history of hypertension, hyperlipidemia, coronary artery disease and tobacco abuse hx.  She has quit smoking.  She will remain on aspirin.  Talked to her about risk factors associated with this degree of small vessel disease.  3.  PN, on examination with identified B12 deficiency  -We'll continue B12 injections.  Given today  4.  Probable early cellulitis  -talked with PCP who graciously agreed to see the patient today.  Will send patient to her office now  5.  Follow up is anticipated in the next few months, sooner should new neurologic issues arise.  Much greater than 50% of this visit was spent in counseling and coordinating care.  Total face to face time:  25 min

## 2017-02-12 ENCOUNTER — Ambulatory Visit: Payer: Medicare HMO | Admitting: Neurology

## 2017-02-13 ENCOUNTER — Ambulatory Visit: Payer: Medicare HMO

## 2017-02-13 ENCOUNTER — Encounter: Payer: Self-pay | Admitting: Neurology

## 2017-02-13 ENCOUNTER — Ambulatory Visit: Payer: Medicare HMO | Admitting: Neurology

## 2017-02-13 VITALS — BP 106/66 | HR 76 | Ht 63.0 in | Wt 157.0 lb

## 2017-02-13 DIAGNOSIS — G20 Parkinson's disease: Secondary | ICD-10-CM

## 2017-02-13 DIAGNOSIS — E538 Deficiency of other specified B group vitamins: Secondary | ICD-10-CM | POA: Diagnosis not present

## 2017-02-13 DIAGNOSIS — L03116 Cellulitis of left lower limb: Secondary | ICD-10-CM

## 2017-02-13 DIAGNOSIS — R251 Tremor, unspecified: Secondary | ICD-10-CM | POA: Diagnosis not present

## 2017-02-13 DIAGNOSIS — L03115 Cellulitis of right lower limb: Secondary | ICD-10-CM | POA: Diagnosis not present

## 2017-02-13 MED ORDER — CYANOCOBALAMIN 1000 MCG/ML IJ SOLN
1000.0000 ug | Freq: Once | INTRAMUSCULAR | Status: AC
  Administered 2017-02-13: 1000 ug via INTRAMUSCULAR

## 2017-02-13 MED ORDER — CARBIDOPA-LEVODOPA 25-100 MG PO TABS: 1.0000 | ORAL_TABLET | Freq: Three times a day (TID) | ORAL | 3 refills | Status: DC

## 2017-02-13 NOTE — Patient Instructions (Signed)
1. Start Carbidopa Levodopa as follows:  Take 1/2 tablet three times daily, at least 30 minutes before meals, for one week  Then take 1/2 tablet in the morning, 1/2 tablet in the afternoon, 1 tablet in the evening, at least 30 minutes before meals, for one week  Then take 1/2 tablet in the morning, 1 tablet in the afternoon, 1 tablet in the evening, at least 30 minutes before meals, for one week  Then take 1 tablet three times daily, at least 30 minutes before meals  

## 2017-02-17 ENCOUNTER — Ambulatory Visit: Payer: Medicare HMO

## 2017-02-28 DIAGNOSIS — R69 Illness, unspecified: Secondary | ICD-10-CM | POA: Diagnosis not present

## 2017-03-13 ENCOUNTER — Ambulatory Visit
Admission: RE | Admit: 2017-03-13 | Discharge: 2017-03-13 | Disposition: A | Discharge status: Home / Self Care | Payer: Medicare HMO | Source: Ambulatory Visit | Attending: Family Medicine | Admitting: Family Medicine

## 2017-03-13 ENCOUNTER — Other Ambulatory Visit: Payer: Self-pay | Admitting: Family Medicine

## 2017-03-13 DIAGNOSIS — R059 Cough, unspecified: Secondary | ICD-10-CM

## 2017-03-13 DIAGNOSIS — R05 Cough: Secondary | ICD-10-CM | POA: Diagnosis not present

## 2017-03-13 DIAGNOSIS — Z0000 Encounter for general adult medical examination without abnormal findings: Secondary | ICD-10-CM | POA: Diagnosis not present

## 2017-03-13 DIAGNOSIS — K219 Gastro-esophageal reflux disease without esophagitis: Secondary | ICD-10-CM | POA: Diagnosis not present

## 2017-03-13 DIAGNOSIS — G20 Parkinson's disease: Secondary | ICD-10-CM | POA: Diagnosis not present

## 2017-03-13 DIAGNOSIS — E7800 Pure hypercholesterolemia, unspecified: Secondary | ICD-10-CM | POA: Diagnosis not present

## 2017-03-13 DIAGNOSIS — I2510 Atherosclerotic heart disease of native coronary artery without angina pectoris: Secondary | ICD-10-CM | POA: Diagnosis not present

## 2017-03-13 DIAGNOSIS — I119 Hypertensive heart disease without heart failure: Secondary | ICD-10-CM | POA: Diagnosis not present

## 2017-03-13 DIAGNOSIS — M8580 Other specified disorders of bone density and structure, unspecified site: Secondary | ICD-10-CM | POA: Diagnosis not present

## 2017-03-17 ENCOUNTER — Ambulatory Visit (INDEPENDENT_AMBULATORY_CARE_PROVIDER_SITE_OTHER): Payer: Medicare HMO | Admitting: Neurology

## 2017-03-17 DIAGNOSIS — E538 Deficiency of other specified B group vitamins: Secondary | ICD-10-CM | POA: Diagnosis not present

## 2017-03-17 MED ORDER — CYANOCOBALAMIN 1000 MCG/ML IJ SOLN
1000.0000 ug | Freq: Once | INTRAMUSCULAR | Status: AC
  Administered 2017-03-17: 1000 ug via INTRAMUSCULAR

## 2017-03-17 NOTE — Progress Notes (Signed)
B12 injection to left deltoid with no apparent complications.   

## 2017-04-14 ENCOUNTER — Ambulatory Visit (INDEPENDENT_AMBULATORY_CARE_PROVIDER_SITE_OTHER): Payer: Medicare HMO | Admitting: Neurology

## 2017-04-14 DIAGNOSIS — E538 Deficiency of other specified B group vitamins: Secondary | ICD-10-CM

## 2017-04-14 MED ORDER — CYANOCOBALAMIN 1000 MCG/ML IJ SOLN
1000.0000 ug | Freq: Once | INTRAMUSCULAR | Status: AC
  Administered 2017-04-14: 1000 ug via INTRAMUSCULAR

## 2017-04-14 NOTE — Progress Notes (Signed)
B12 injection to left deltoid with no apparent complications.   

## 2017-05-19 ENCOUNTER — Ambulatory Visit (INDEPENDENT_AMBULATORY_CARE_PROVIDER_SITE_OTHER): Payer: Medicare HMO | Admitting: Neurology

## 2017-05-19 DIAGNOSIS — E538 Deficiency of other specified B group vitamins: Secondary | ICD-10-CM | POA: Diagnosis not present

## 2017-05-19 MED ORDER — CYANOCOBALAMIN 1000 MCG/ML IJ SOLN
1000.0000 ug | Freq: Once | INTRAMUSCULAR | Status: AC
  Administered 2017-05-19: 1000 ug via INTRAMUSCULAR

## 2017-05-19 NOTE — Progress Notes (Signed)
B12 injection to left deltoid with no apparent complications.   

## 2017-06-01 DIAGNOSIS — Z803 Family history of malignant neoplasm of breast: Secondary | ICD-10-CM | POA: Diagnosis not present

## 2017-06-01 DIAGNOSIS — Z1231 Encounter for screening mammogram for malignant neoplasm of breast: Secondary | ICD-10-CM | POA: Diagnosis not present

## 2017-06-02 NOTE — Progress Notes (Addendum)
Subjective:   Jacqueline Flores was seen in consultation in the movement disorder clinic at the request of Dr. Laurann Montana.  Pts PCP is Mayra Neer, MD.  The evaluation is for tremor.  The patient is a 82 y.o. right handed female with a history of tremor.  Tremor just started in the R hand after a fall from a truck 3 weeks ago.  She was getting into her truck and literally fell out attempting to get in.  She fell on the L side of the body and L hip.  Did not hit the head, just the shoulder and ribs and buttocks;  A few days later, she noted tremor of the R hand (husband actually noted it first when it was at rest).  Not noted it when using it, but notes it when ambulating.    Tremor details: Affected by caffeine:  No.  (2-3 medium sized cups per day) Affected by alcohol: unknown Affected by stress:  No. Affected by fatigue:  Yes.   Spills soup if on spoon:  No. Spills glass of liquid if full:  No. Affects ADL's (tying shoes, brushing teeth, etc):  No.  04/04/14 update:  The patient is following up today, earlier than expected.  She is accompanied by her daughter who supplements the history.  She thinks that tremor has increased on the right.  She did have an MRI of the brain since last visit that revealed  atrophy and relatively severe small vessel disease.  She is already on ASA.  She had lab workup since last visit for reversible causes of peripheral neuropathy.  Her B12 was quite low at 199.  She has been started on injections and has been receiving them faithfully.  Her RPR was negative, TSH was normal, and there was no M spike in the serum protein electrophoresis.  There is no monoclonal proteins and urine protein electrophoresis.    She is c/o dry eye and has been to the eye doctor.  She is using drops and a gel in her eyes.  No diplopia.  She has had no falls since last visit, but she has "stumbled some."  Daughter states that she hears the patient "shuffle more."  Daughter states that the  biggest issue is her anxiety/depression as she will cry when she has days of tremor.  She and her daughter talk about her "parkinsons."  10/05/14 update:  The patient is following up today, accompanied by her daughter (a different one than came last visit) who supplements the history.  She attended physical therapy for gait and balance since our last visit.  She states that has really helped and she can even get out of the bathtub now.  She has been faithfully receiving her B12 injections here at the office.  She had a repeat B12 level that was a trough level and it was 473.  In regards to her tremor, it has been about the same.  She has had no falls since our last visit.  No lightheadedness or near syncope.  No hallucinations.  04/04/15 update:  The patient follows up today, accompanied by her daughter who supplements the history.  She has a history of B12 deficiency and was receiving B12 injections faithfully last year, but has not come this year for injections.  She states that she had bronchitis and just didn't want to come in for injections; she requests one today.  In regards to tremor, the patient states that it is about the same.  She  denies any falls since our last visit.  She denies lightheadedness or near syncope.  She does have a history of rather significant cerebral small vessel disease and was occasionally smoking last visit, and I encouraged her to discontinue that altogether.  She has done that.  She is still taking her aspirin faithfully.  She is exercising faithfully and states that she can get in and out of the bathtub and didn't used to be able to do that.  She is really pleased.    10/05/15 update:   She reports doing well over the last 6 months.  She fell one time down basement stairs because her knee gave out and she broke 3 toes.  She [place] stair at beach and thought she was at bottom stair and she wasn't.  She didn't get hurt that time.    No hallucinations.  No lightheadedness or  near syncope.  She is exercising.  She is receiving monthly B12 injections for her B12 deficiency, the last of which was 09/14/15.  She still isn't smoking.  Isn't exercising as faithful.    02/05/16 update:  Patient follows up today.  She has been receiving B12 injections for B12 deficiency.  Overall, the patient thinks that she has been stable.  Pt denies falls.  Pt denies lightheadedness, near syncope.  No hallucinations.  Mood has been good.  She hasn't been doing as much exercise.    06/04/16 update: Patient seen today in follow-up.  This patient is accompanied in the office by her daughter who supplements the history.   The patient continues to have some tremor.  She does not think that has progressed significantly.  She has not had any falls.  She has not been exercising.  She states that her mood has been good.  She has been receiving her injections for B12 deficiency.  09/25/16 update:  Pt f/u today for PD.  Has been faithfully receiving her B12 injections.  Is not on any medication currently for PD.  States that she has been doing well.   The records that were made available to me were reviewed since last visit.    Falls:   No. (going PT 3 times per week) N/V:  No. Hallucinations:  No.  visual distortions: No. Lightheaded:  No.  Syncope: No. Dyskinesia:  No.   02/13/17 update: Patient is seen today in follow-up for Parkinson's disease.  She is currently on no medication, by her choice.  She reports that she is doing well.  She fell 12/27 going up the stairs and has a place on her leg.  She denies any lightheadedness or near syncope.  No hallucinations.  No new medical issues.  I have reviewed records available to me since last visit.  She did get out of the habit of exercise  06/04/17 update: Patient is seen today in follow-up for Parkinson's disease.  She is accompanied by her daughter who supplements the history.  She was started on carbidopa/levodopa 25/100 last visit and is supposed to be on 1  tablet 3 times per day. she is only on carbidopa/levodopa 25/100, 1/2 po tid because she states that a full pill caused diarrhea.  She did see Dr. Brigitte Pulse for cellulitis the same day that she saw me last visit and reports was on abx for 3 months.   Pt denies falls.  Pt denies lightheadedness, near syncope.  No hallucinations.  Mood has been good.  Records have been reviewed since last visit.  She continues  to get injections faithfully for B12 deficiency.  She reports muscle/back pain.  The myalgias are in the arms and legs.  She does think that she did better when she was going to PT than now.  Outside reports reviewed: historical medical records, lab reports and office notes.  Allergies  Allergen Reactions  . Zetia [Ezetimibe]     Swelling   . Sulfa Antibiotics Rash    Outpatient Encounter Medications as of 06/04/2017  Medication Sig  . aspirin 81 MG tablet Take 81 mg by mouth daily.  . Biotin 1 MG CAPS Take by mouth.  . Calcium Carbonate (CALCIUM 600) 1500 MG TABS Take by mouth.  . carbidopa-levodopa (SINEMET IR) 25-100 MG tablet Take 1 tablet by mouth 3 (three) times daily. (Patient taking differently: Take 0.5 tablets by mouth 3 (three) times daily. )  . Cranberry 1000 MG CAPS Take by mouth.  . Garlic 10 MG CAPS Take by mouth.  . hydrochlorothiazide (HYDRODIURIL) 25 MG tablet Take 25 mg by mouth as needed.  . moexipril (UNIVASC) 7.5 MG tablet Take 7.5 mg by mouth at bedtime.  Marland Kitchen NIFEdipine (PROCARDIA-XL/ADALAT CC) 30 MG 24 hr tablet Take 30 mg by mouth daily.  . Omega-3 Fatty Acids (FISH OIL) 1000 MG CAPS Take 1 capsule by mouth 3 (three) times daily.  Marland Kitchen omeprazole (PRILOSEC) 40 MG capsule Take 40 mg by mouth daily.  . pravastatin (PRAVACHOL) 80 MG tablet Take 80 mg by mouth daily.   No facility-administered encounter medications on file as of 06/04/2017.     Past Medical History:  Diagnosis Date  . Coronary artery disease    post MI  . Esophageal reflux   . Hematuria   .  Hyperlipidemia   . Hypertension   . MI (myocardial infarction) ([place])    age 22  . Osteoarthritis   . Osteopenia     Past Surgical History:  Procedure Laterality Date  . CATARACT EXTRACTION W/ INTRAOCULAR LENS  IMPLANT, BILATERAL    . DILATION AND CURETTAGE OF UTERUS    . lid lift     bilaterally, lid lag  . NEPHROLITHOTOMY      Social History   Socioeconomic History  . Marital status: Married    Spouse name: Not on file  . Number of children: Not on file  . Years of education: Not on file  . Highest education level: Not on file  Occupational History    Employer: RETIRED    Comment: [employer]  Social Needs  . Financial resource strain: Not on file  . Food insecurity:    Worry: Not on file    Inability: Not on file  . Transportation needs:    Medical: Not on file    Non-medical: Not on file  Tobacco Use  . Smoking status: Former Smoker    Years: 30.00    Last attempt to quit: 10/28/2010    Years since quitting: 6.6  . Smokeless tobacco: Never Used  Substance and Sexual Activity  . Alcohol use: Yes    Comment: glass of wine once a month  . Drug use: No  . Sexual activity: Not on file  Lifestyle  . Physical activity:    Days per week: Not on file    Minutes per session: Not on file  . Stress: Not on file  Relationships  . Social connections:    Talks on phone: Not on file    Gets together: Not on file    Attends religious service: Not  on file    Active member of club or organization: Not on file    Attends meetings of clubs or organizations: Not on file    Relationship status: Not on file  . Intimate partner violence:    Fear of current or ex partner: Not on file    Emotionally abused: Not on file    Physically abused: Not on file    Forced sexual activity: Not on file  Other Topics Concern  . Not on file  Social History Narrative  . Not on file    Family Status  Relation Name Status  . Mother  Deceased       Breast Cancer  . Father   Deceased at age 80       MI, CAD  . Sister ##Sister1 Alive       healthy  . MGM  Deceased       DM  . Daughter  Alive       healthy  . Daughter  Alive       healthy    Review of Systems Admits to some neck pain.  Admits to some cough.  A complete 10 system ROS was obtained and was negative apart from what is mentioned.   Objective:   VITALS:   Vitals:   06/04/17 1058  BP: 128/64  Pulse: 72  SpO2: 95%  Weight: 152 lb (68.9 kg)  Height: '5\' 3"'  (1.6 m)   Gen:  Appears stated age and in NAD. HEENT:  Normocephalic, atraumatic. The mucous membranes are moist. The superficial temporal arteries are without ropiness or tenderness. Cardiovascular: Regular rate and rhythm. Lungs: Clear to auscultation bilaterally. Neck: There are no carotid bruits noted bilaterally. Skin:  Leg wound healed.  Cellulitis on R leg healed  NEUROLOGICAL: Orientation:  The patient is alert and oriented x 3.    Cranial nerves: There is good facial symmetry.  Soft palate rises symmetrically and there is no tongue deviation. Hearing is intact to conversational tone. Sensation: Sensation is intact to light touch throughout. Motor: Strength is 5/5 in the bilateral upper and lower extremities.  Shoulder shrug is equal bilaterally.  There is no pronator drift.  There are no fasciculations noted.  MOVEMENT EXAM: Tone: no rigidity today Abnormal movements:  There is no postural tremor.  There is no resting tremor today when seated, even with distraction. Coordination:  There is only decremation with toe taps on the left.  All other rapid alternating movements were normal today.  Gait and Station: Pt arises OOC without trouble.  She has re-emergent tremor in the UE bilaterally.  She is dragging her feet just a little.    Labs:   Lab Results  Component Value Date   [id number] 04/20/2014   Addendum labs: Lab work is received and was dated March 13, 2017.  Sodium was 141, potassium 4.3, chloride 106, CO2  27, BUN 21, creatinine 1.18.   Assessment/Plan:   1.  Parkinsons Disease, dx formally in 06/2016  -Pt has had tremor for long time but is just now (06/2016) meeting criteria for PD  -Long discussion with patient today.  I do not think it was the levodopa that caused diarrhea.  Instead, I think it was the multiple antibiotics that she started the same day that she started levodopa.  She will increase levodopa back to carbidopa/levodopa 25/100, 1 tablet 3 times per day.   -needs to get back to get back to exercise  -discussed PD exercise programs.  Met with Education officer, museum as well   2.  Cerebral small vessel disease  -Relatively significant on her MRI of the brain.  She has a history of hypertension, hyperlipidemia, coronary artery disease and tobacco abuse hx.  She has quit smoking.  She will remain on aspirin.  Talked to her about risk factors associated with this degree of small vessel disease.  3.  PN, on examination with identified B12 deficiency  -change injections to oral pills.  Turns out she was already taking these on top of the injections.  Told her to take 1000 mcg daily and we will recheck B12 level in the future.  4.  myalgia  -hold statin for 2-3 weeks.  Understands risks of this.  We will call her in the near future and see how she is doing.  5.  Cough  -Unassociated with Parkinson's disease.  It does not sound like this is just happening when eating, but at various times.  She will talk to her primary care physician.  6.  Urinary incontinence  -on diuretic.  Only has incontinence 1 time per month.  Talked about wearing undergarments, but she really does not want to.  She wants no further medication.  7.  Follow up is anticipated in the next few months, sooner should new neurologic issues arise.  Much greater than 50% of this visit was spent in counseling and coordinating care.  Total face to face time:  30 min

## 2017-06-04 ENCOUNTER — Encounter: Payer: Self-pay | Admitting: Psychology

## 2017-06-04 ENCOUNTER — Ambulatory Visit: Payer: Medicare HMO | Admitting: Neurology

## 2017-06-04 ENCOUNTER — Encounter: Payer: Self-pay | Admitting: Neurology

## 2017-06-04 VITALS — BP 128/64 | HR 72 | Ht 63.0 in | Wt 152.0 lb

## 2017-06-04 DIAGNOSIS — E538 Deficiency of other specified B group vitamins: Secondary | ICD-10-CM | POA: Diagnosis not present

## 2017-06-04 DIAGNOSIS — G20A1 Parkinson's disease without dyskinesia, without mention of fluctuations: Secondary | ICD-10-CM

## 2017-06-04 DIAGNOSIS — R251 Tremor, unspecified: Secondary | ICD-10-CM

## 2017-06-04 DIAGNOSIS — G20 Parkinson's disease: Secondary | ICD-10-CM

## 2017-06-04 NOTE — Progress Notes (Signed)
I met with the patient and her daughter today in the clinic.  I reviewed Parkinson's exercise resources and we talked a little bit about starting a Parkinson's exercise program where she is getting at least 3 days a week of cardiovascular exercise.  The patient presented with a few barriers to suggestions.  I suggested that  she will be the only person that will be able to write through some of those barriers and/or find a way to enter great exercise in her life through compromise.  She would like to remain exercising and her current gym at the hospital which is reasonable.  The plan is that the patient and her daughter will check out a Parkinson's cycle class to help encourage an understanding of the exercise that she needs to incorporate in her lifestyle.

## 2017-06-04 NOTE — Patient Instructions (Addendum)
1. Increase Carbidopa Levodopa to one tablet three times daily.   2. Hold your pravastatin for two weeks. I will call to see how you are doing with muscle aches.   3. Stop B12 injections. Continue oral B12 supplement 1000 mcg daily. We will check your level next visit.     [facility] [employer] & Movement Disorders  The [facility] Parkinson's and Movement Disorders team know that living well with a movement disorder extends far beyond our clinic walls. We are together with you. Our team is passionate about providing resources to you and your loved ones who are living with Parkinson's disease and movement disorders. Participate in these programs and join our community. These resources are free or low cost!    Parkinson's and Movement Disorders Program is adding:   Innovative educational programs for patients and caregivers.   Support groups for patients and caregivers living with Parkinson's disease.   Parkinson's specific exercise programs.   Custom tailored therapeutic programs that will benefit patient's living with Parkinson's disease.   We are in this together. You can help and contribute to grow these programs and resources in our community. 100% of the funds donated to the [facility] stays right here in our community to support patients and their caregivers.  To make a tax deductible contribution:  -ask for a [facility] for Parkinson's envelope in the office today.  - call the Office of Institutional Advancement at [phone].     Registration is OPEN!    Third Annual Parkinson's Education Symposium   To register: [email or web address]      Search:  [employer] person attending individually Questions: [place], [place] or [email or web address]

## 2017-06-16 ENCOUNTER — Ambulatory Visit: Payer: Medicare HMO

## 2017-06-19 ENCOUNTER — Telehealth: Payer: Self-pay | Admitting: Neurology

## 2017-06-19 NOTE — Telephone Encounter (Signed)
-----   Message from Annamaria Helling, [place] sent at 06/04/2017 11:42 AM EDT ----- See how muscle aches are after holding pravastatin

## 2017-06-19 NOTE — Telephone Encounter (Signed)
Called patient to see how her muscle aches were doing off Pravastatin. LMOM for her to call back.

## 2017-07-13 ENCOUNTER — Telehealth: Payer: Self-pay | Admitting: Neurology

## 2017-07-13 NOTE — Telephone Encounter (Signed)
Daughter made aware form complete. Mailed to patient per their request.

## 2017-07-13 NOTE — Telephone Encounter (Signed)
Okay to fill out handicap placard paperwork for patient?

## 2017-07-13 NOTE — Telephone Encounter (Signed)
ok 

## 2017-07-13 NOTE — Telephone Encounter (Signed)
Patients Daughter is wanting a handicap sticker please call

## 2017-07-20 NOTE — Telephone Encounter (Signed)
Another form mailed to patient.

## 2017-07-20 NOTE — Telephone Encounter (Signed)
Pt's daughter best call back# [phone].

## 2017-07-20 NOTE — Telephone Encounter (Signed)
Pt's daughter calling states pt's husband signed in incorrect spot and they are needing new forms sent to them.

## 2017-08-16 ENCOUNTER — Other Ambulatory Visit: Payer: Self-pay | Admitting: Neurology

## 2017-09-30 DIAGNOSIS — L814 Other melanin hyperpigmentation: Secondary | ICD-10-CM | POA: Diagnosis not present

## 2017-09-30 DIAGNOSIS — D1801 Hemangioma of skin and subcutaneous tissue: Secondary | ICD-10-CM | POA: Diagnosis not present

## 2017-09-30 DIAGNOSIS — L570 Actinic keratosis: Secondary | ICD-10-CM | POA: Diagnosis not present

## 2017-09-30 DIAGNOSIS — L821 Other seborrheic keratosis: Secondary | ICD-10-CM | POA: Diagnosis not present

## 2017-10-27 NOTE — Progress Notes (Signed)
Subjective:   Jacqueline Flores was seen in consultation in the movement disorder clinic at the request of Dr. Laurann Flores.  Pts PCP is Jacqueline Neer, MD.  The evaluation is for tremor.  The patient is a 82 y.o. right handed female with a history of tremor.  Tremor just started in the R hand after a fall from a truck 3 weeks ago.  She was getting into her truck and literally fell out attempting to get in.  She fell on the L side of the body and L hip.  Did not hit the head, just the shoulder and ribs and buttocks;  A few days later, she noted tremor of the R hand (husband actually noted it first when it was at rest).  Not noted it when using it, but notes it when ambulating.    Tremor details: Affected by caffeine:  No.  (2-3 medium sized cups per day) Affected by alcohol: unknown Affected by stress:  No. Affected by fatigue:  Yes.   Spills soup if on spoon:  No. Spills glass of liquid if full:  No. Affects ADL's (tying shoes, brushing teeth, etc):  No.  04/04/14 update:  The patient is following up today, earlier than expected.  She is accompanied by her daughter who supplements the history.  She thinks that tremor has increased on the right.  She did have an MRI of the brain since last visit that revealed  atrophy and relatively severe small vessel disease.  She is already on ASA.  She had lab workup since last visit for reversible causes of peripheral neuropathy.  Her B12 was quite low at 199.  She has been started on injections and has been receiving them faithfully.  Her RPR was negative, TSH was normal, and there was no M spike in the serum protein electrophoresis.  There is no monoclonal proteins and urine protein electrophoresis.    She is c/o dry eye and has been to the eye doctor.  She is using drops and a gel in her eyes.  No diplopia.  She has had no falls since last visit, but she has "stumbled some."  Daughter states that she hears the patient "shuffle more."  Daughter states that the  biggest issue is her anxiety/depression as she will cry when she has days of tremor.  She and her daughter talk about her "parkinsons."  10/05/14 update:  The patient is following up today, accompanied by her daughter (a different one than came last visit) who supplements the history.  She attended physical therapy for gait and balance since our last visit.  She states that has really helped and she can even get out of the bathtub now.  She has been faithfully receiving her B12 injections here at the office.  She had a repeat B12 level that was a trough level and it was 473.  In regards to her tremor, it has been about the same.  She has had no falls since our last visit.  No lightheadedness or near syncope.  No hallucinations.  04/04/15 update:  The patient follows up today, accompanied by her daughter who supplements the history.  She has a history of B12 deficiency and was receiving B12 injections faithfully last year, but has not come this year for injections.  She states that she had bronchitis and just didn't want to come in for injections; she requests one today.  In regards to tremor, the patient states that it is about the same.  She  denies any falls since our last visit.  She denies lightheadedness or near syncope.  She does have a history of rather significant cerebral small vessel disease and was occasionally smoking last visit, and I encouraged her to discontinue that altogether.  She has done that.  She is still taking her aspirin faithfully.  She is exercising faithfully and states that she can get in and out of the bathtub and didn't used to be able to do that.  She is really pleased.    10/05/15 update:   She reports doing well over the last 6 months.  She fell one time down basement stairs because her knee gave out and she broke 3 toes.  She also [place] stair at beach and thought she was at bottom stair and she wasn't.  She didn't get hurt that time.    No hallucinations.  No lightheadedness or  near syncope.  She is exercising.  She is receiving monthly B12 injections for her B12 deficiency, the last of which was 09/14/15.  She still isn't smoking.  Isn't exercising as faithful.    02/05/16 update:  Patient follows up today.  She has been receiving B12 injections for B12 deficiency.  Overall, the patient thinks that she has been stable.  Pt denies falls.  Pt denies lightheadedness, near syncope.  No hallucinations.  Mood has been good.  She hasn't been doing as much exercise.    06/04/16 update: Patient seen today in follow-up.  This patient is accompanied in the office by her daughter who supplements the history.   The patient continues to have some tremor.  She does not think that has progressed significantly.  She has not had any falls.  She has not been exercising.  She states that her mood has been good.  She has been receiving her injections for B12 deficiency.  09/25/16 update:  Pt f/u today for PD.  Has been faithfully receiving her B12 injections.  Is not on any medication currently for PD.  States that she has been doing well.   The records that were made available to me were reviewed since last visit.    Falls:   No. (going PT 3 times per week) N/V:  No. Hallucinations:  No.  visual distortions: No. Lightheaded:  No.  Syncope: No. Dyskinesia:  No.   02/13/17 update: Patient is seen today in follow-up for Parkinson's disease.  She is currently on no medication, by her choice.  She reports that she is doing well.  She fell 12/27 going up the stairs and has a place on her leg.  She denies any lightheadedness or near syncope.  No hallucinations.  No new medical issues.  I have reviewed records available to me since last visit.  She did get out of the habit of exercise  06/04/17 update: Patient is seen today in follow-up for Parkinson's disease.  She is accompanied by her daughter who supplements the history.  She was started on carbidopa/levodopa 25/100 last visit and is supposed to be on 1  tablet 3 times per day. she is only on carbidopa/levodopa 25/100, 1/2 po tid because she states that a full pill caused diarrhea.  She did see Dr. Brigitte Pulse for cellulitis the same day that she saw me last visit and reports was on abx for 3 months.   Pt denies falls.  Pt denies lightheadedness, near syncope.  No hallucinations.  Mood has been good.  Records have been reviewed since last visit.  She continues  to get injections faithfully for B12 deficiency.  She reports muscle/back pain.  The myalgias are in the arms and legs.  She does think that she did better when she was going to PT than now.  10/28/17 update: Patient is seen today in follow-up for Parkinson's disease.  This patient is accompanied in the office by her daughter who supplements the history.  Last visit, I told her to increase her carbidopa/levodopa 25/100 to 1 tablet 3 times per day (8am/noon/5pm).  Daughter not sure that it helps but "I don't see any regression."  She is going to PT.    She did that.  She is doing better.  She had a fall a few months ago.   She was in the garden in mulch and fell in it.  Daughter worried about safety in the home when husband gone as patient walks up and down basement stairs for exercise (and laundry).     No lightheadedness or near syncope.  No hallucinations.  Outside reports reviewed: historical medical records, lab reports and office notes.  Allergies  Allergen Reactions  . Zetia [Ezetimibe]     Swelling   . Sulfa Antibiotics Rash    Outpatient Encounter Medications as of 10/28/2017  Medication Sig  . aspirin 81 MG tablet Take 81 mg by mouth daily.  . Biotin 1 MG CAPS Take by mouth.  . Calcium Carbonate (CALCIUM 600) 1500 MG TABS Take by mouth.  . carbidopa-levodopa (SINEMET IR) 25-100 MG tablet Take 1 tablet by mouth 3 (three) times daily.  . Cranberry 1000 MG CAPS Take by mouth.  . Garlic 10 MG CAPS Take by mouth.  . hydrochlorothiazide (HYDRODIURIL) 25 MG tablet Take 25 mg by mouth as needed.    . moexipril (UNIVASC) 7.5 MG tablet Take 7.5 mg by mouth at bedtime.  Marland Kitchen NIFEdipine (PROCARDIA-XL/ADALAT CC) 30 MG 24 hr tablet Take 30 mg by mouth daily.  . Omega-3 Fatty Acids (FISH OIL) 1000 MG CAPS Take 1 capsule by mouth 3 (three) times daily.  Marland Kitchen omeprazole (PRILOSEC) 40 MG capsule Take 40 mg by mouth daily.  . pravastatin (PRAVACHOL) 80 MG tablet Take 80 mg by mouth daily.  . [DISCONTINUED] carbidopa-levodopa (SINEMET IR) 25-100 MG tablet TAKE ONE (1) TABLET THREE (3) TIMES EACH DAY   No facility-administered encounter medications on file as of 10/28/2017.     Past Medical History:  Diagnosis Date  . Coronary artery disease    post MI  . Esophageal reflux   . Hematuria   . Hyperlipidemia   . Hypertension   . MI (myocardial infarction) ([place])    age 12  . Osteoarthritis   . Osteopenia     Past Surgical History:  Procedure Laterality Date  . CATARACT EXTRACTION W/ INTRAOCULAR LENS  IMPLANT, BILATERAL    . DILATION AND CURETTAGE OF UTERUS    . lid lift     bilaterally, lid lag  . NEPHROLITHOTOMY      Social History   Socioeconomic History  . Marital status: Married    Spouse name: Not on file  . Number of children: Not on file  . Years of education: Not on file  . Highest education level: Not on file  Occupational History    Employer: RETIRED    Comment: [employer]  Social Needs  . Financial resource strain: Not on file  . Food insecurity:    Worry: Not on file    Inability: Not on file  . Transportation needs:  Medical: Not on file    Non-medical: Not on file  Tobacco Use  . Smoking status: Former Smoker    Years: 30.00    Last attempt to quit: 10/28/2010    Years since quitting: 7.0  . Smokeless tobacco: Never Used  Substance and Sexual Activity  . Alcohol use: Yes    Comment: glass of wine once a month  . Drug use: No  . Sexual activity: Not on file  Lifestyle  . Physical activity:    Days per week: Not on file    Minutes per session:  Not on file  . Stress: Not on file  Relationships  . Social connections:    Talks on phone: Not on file    Gets together: Not on file    Attends religious service: Not on file    Active member of club or organization: Not on file    Attends meetings of clubs or organizations: Not on file    Relationship status: Not on file  . Intimate partner violence:    Fear of current or ex partner: Not on file    Emotionally abused: Not on file    Physically abused: Not on file    Forced sexual activity: Not on file  Other Topics Concern  . Not on file  Social History Narrative  . Not on file    Family Status  Relation Name Status  . Mother  Deceased       Breast Cancer  . Father  Deceased at age 31       MI, CAD  . Sister ##Sister1 Alive       healthy  . MGM  Deceased       DM  . Daughter  Alive       healthy  . Daughter  Alive       healthy    Review of Systems Review of Systems  Constitutional: Negative.   HENT: Negative.   Eyes: Negative.   Respiratory: Negative.   Cardiovascular: Negative.   Gastrointestinal: Negative.   Genitourinary: Negative.   Musculoskeletal: Negative.   Skin: Negative.   Endo/Heme/Allergies: Negative.       Objective:   VITALS:   Vitals:   10/28/17 1109  BP: 100/60  Pulse: 76  SpO2: 93%  Weight: 149 lb (67.6 kg)  Height: 5\' 3"  (1.6 m)   GEN:  The patient appears stated age and is in NAD. HEENT:  Normocephalic, atraumatic.  The mucous membranes are moist. The superficial temporal arteries are without ropiness or tenderness. CV:  RRR Lungs:  CTAB Neck/HEME:  There are no carotid bruits bilaterally.  Neurological examination:  Orientation: The patient is alert and oriented x3. Cranial nerves: There is good facial symmetry. The speech is fluent and clear. Soft palate rises symmetrically and there is no tongue deviation. Hearing is intact to conversational tone. Sensation: Sensation is intact to light touch throughout Motor: Strength  is 5/5 in the bilateral upper and lower extremities.   Shoulder shrug is equal and symmetric.  There is no pronator drift.  MOVEMENT EXAM: Tone: no rigidity today Abnormal movements:  There is no postural tremor.  There is no resting tremor today when seated, even with distraction. Coordination:  There is only decremation with toe taps on the left (same as last visit).  All other rapid alternating movements were normal today.  Gait and Station: Pt arises OOC without trouble.  She has re-emergent tremor in the UE bilaterally.  She is not  shuffling    Labs:    Lab work is received and was dated March 13, 2017.  Sodium was 141, potassium 4.3, chloride 106, CO2 27, BUN 21, creatinine 1.18.   Assessment/Plan:   1.  Parkinsons Disease, dx formally in 06/2016  -mild and may represent benign tremulous parkinsons.  Continue carbidopa/levodopa 25/100 tid  -needs to get back to get back to exercise  -Talked extensively about safety in the home.  Talked about having a life alert.  Talked about using ski poles when she is outside.  Talked about not going up and down stairs when her husband or someone else is not in the home.  Information given on various life alert companies.   2.  Cerebral small vessel disease  -Relatively significant on her MRI of the brain.  She has a history of hypertension, hyperlipidemia, coronary artery disease and tobacco abuse hx.  She has quit smoking.  She will remain on aspirin.  Talked to her about risk factors associated with this degree of small vessel disease.  3.  PN, on examination with identified B12 deficiency  -changed her to oral b12 and will do level today.  4.  b12 deficiency  -now on oral instead of injection.  Will check lab today 5.  Follow up is anticipated in 5 months, sooner should new neurologic issues arise.  Much greater than 50% of this visit was spent in counseling and coordinating care.  Total face to face time:  25 min

## 2017-10-28 ENCOUNTER — Other Ambulatory Visit (INDEPENDENT_AMBULATORY_CARE_PROVIDER_SITE_OTHER): Payer: Medicare HMO

## 2017-10-28 ENCOUNTER — Encounter: Payer: Self-pay | Admitting: Neurology

## 2017-10-28 ENCOUNTER — Ambulatory Visit: Payer: Medicare HMO | Admitting: Neurology

## 2017-10-28 VITALS — BP 100/60 | HR 76 | Ht 63.0 in | Wt 149.0 lb

## 2017-10-28 DIAGNOSIS — G20 Parkinson's disease: Secondary | ICD-10-CM | POA: Diagnosis not present

## 2017-10-28 DIAGNOSIS — E538 Deficiency of other specified B group vitamins: Secondary | ICD-10-CM

## 2017-10-28 DIAGNOSIS — G20A1 Parkinson's disease without dyskinesia, without mention of fluctuations: Secondary | ICD-10-CM

## 2017-10-28 MED ORDER — CARBIDOPA-LEVODOPA 25-100 MG PO TABS: 1.0000 | ORAL_TABLET | Freq: Three times a day (TID) | ORAL | 1 refills | Status: DC

## 2017-10-28 NOTE — Patient Instructions (Signed)
1. Your provider has requested that you have labwork completed today. Please go to [facility] ([id number]) on the second floor of this building before leaving the office today. You do not need to check in. If you are not called within 15 minutes please check with the front desk.   

## 2017-10-29 ENCOUNTER — Telehealth: Payer: Self-pay | Admitting: Neurology

## 2017-10-29 LAB — VITAMIN B12

## 2017-10-29 NOTE — Telephone Encounter (Signed)
-----   Message from [place]idan [place], DO sent at 10/29/2017  9:12 AM EDT ----- b12 is supratherapeutic.  She can probably stop her supplement and we can recheck in future

## 2017-10-29 NOTE — Telephone Encounter (Signed)
Patient made aware. She will stop her supplement.

## 2017-11-27 DIAGNOSIS — R69 Illness, unspecified: Secondary | ICD-10-CM | POA: Diagnosis not present

## 2018-03-29 NOTE — Progress Notes (Signed)
Subjective:   Jacqueline Flores was seen in consultation in the movement disorder clinic at the request of Dr. Laurann Montana.  Pts PCP is Mayra Neer, MD.  The evaluation is for tremor.  The patient is a 83 y.o. right handed female with a history of tremor.  Tremor just started in the R hand after a fall from a truck 3 weeks ago.  She was getting into her truck and literally fell out attempting to get in.  She fell on the L side of the body and L hip.  Did not hit the head, just the shoulder and ribs and buttocks;  A few days later, she noted tremor of the R hand (husband actually noted it first when it was at rest).  Not noted it when using it, but notes it when ambulating.    Tremor details: Affected by caffeine:  No.  (2-3 medium sized cups per day) Affected by alcohol: unknown Affected by stress:  No. Affected by fatigue:  Yes.   Spills soup if on spoon:  No. Spills glass of liquid if full:  No. Affects ADL's (tying shoes, brushing teeth, etc):  No.  04/04/14 update:  The patient is following up today, earlier than expected.  She is accompanied by her daughter who supplements the history.  She thinks that tremor has increased on the right.  She did have an MRI of the brain since last visit that revealed  atrophy and relatively severe small vessel disease.  She is already on ASA.  She had lab workup since last visit for reversible causes of peripheral neuropathy.  Her B12 was quite low at 199.  She has been started on injections and has been receiving them faithfully.  Her RPR was negative, TSH was normal, and there was no M spike in the serum protein electrophoresis.  There is no monoclonal proteins and urine protein electrophoresis.    She is c/o dry eye and has been to the eye doctor.  She is using drops and a gel in her eyes.  No diplopia.  She has had no falls since last visit, but she has "stumbled some."  Daughter states that she hears the patient "shuffle more."  Daughter states that the  biggest issue is her anxiety/depression as she will cry when she has days of tremor.  She and her daughter talk about her "parkinsons."  10/05/14 update:  The patient is following up today, accompanied by her daughter (a different one than came last visit) who supplements the history.  She attended physical therapy for gait and balance since our last visit.  She states that has really helped and she can even get out of the bathtub now.  She has been faithfully receiving her B12 injections here at the office.  She had a repeat B12 level that was a trough level and it was 473.  In regards to her tremor, it has been about the same.  She has had no falls since our last visit.  No lightheadedness or near syncope.  No hallucinations.  04/04/15 update:  The patient follows up today, accompanied by her daughter who supplements the history.  She has a history of B12 deficiency and was receiving B12 injections faithfully last year, but has not come this year for injections.  She states that she had bronchitis and just didn't want to come in for injections; she requests one today.  In regards to tremor, the patient states that it is about the same.  She  denies any falls since our last visit.  She denies lightheadedness or near syncope.  She does have a history of rather significant cerebral small vessel disease and was occasionally smoking last visit, and I encouraged her to discontinue that altogether.  She has done that.  She is still taking her aspirin faithfully.  She is exercising faithfully and states that she can get in and out of the bathtub and didn't used to be able to do that.  She is really pleased.    10/05/15 update:   She reports doing well over the last 6 months.  She fell one time down basement stairs because her knee gave out and she broke 3 toes.  She [place] stair at beach and thought she was at bottom stair and she wasn't.  She didn't get hurt that time.    No hallucinations.  No lightheadedness or  near syncope.  She is exercising.  She is receiving monthly B12 injections for her B12 deficiency, the last of which was 09/14/15.  She still isn't smoking.  Isn't exercising as faithful.    02/05/16 update:  Patient follows up today.  She has been receiving B12 injections for B12 deficiency.  Overall, the patient thinks that she has been stable.  Pt denies falls.  Pt denies lightheadedness, near syncope.  No hallucinations.  Mood has been good.  She hasn't been doing as much exercise.    06/04/16 update: Patient seen today in follow-up.  This patient is accompanied in the office by her daughter who supplements the history.   The patient continues to have some tremor.  She does not think that has progressed significantly.  She has not had any falls.  She has not been exercising.  She states that her mood has been good.  She has been receiving her injections for B12 deficiency.  09/25/16 update:  Pt f/u today for PD.  Has been faithfully receiving her B12 injections.  Is not on any medication currently for PD.  States that she has been doing well.   The records that were made available to me were reviewed since last visit.    Falls:   No. (going PT 3 times per week) N/V:  No. Hallucinations:  No.  visual distortions: No. Lightheaded:  No.  Syncope: No. Dyskinesia:  No.   02/13/17 update: Patient is seen today in follow-up for Parkinson's disease.  She is currently on no medication, by her choice.  She reports that she is doing well.  She fell 12/27 going up the stairs and has a place on her leg.  She denies any lightheadedness or near syncope.  No hallucinations.  No new medical issues.  I have reviewed records available to me since last visit.  She did get out of the habit of exercise  06/04/17 update: Patient is seen today in follow-up for Parkinson's disease.  She is accompanied by her daughter who supplements the history.  She was started on carbidopa/levodopa 25/100 last visit and is supposed to be on 1  tablet 3 times per day. she is only on carbidopa/levodopa 25/100, 1/2 po tid because she states that a full pill caused diarrhea.  She did see Dr. Brigitte Pulse for cellulitis the same day that she saw me last visit and reports was on abx for 3 months.   Pt denies falls.  Pt denies lightheadedness, near syncope.  No hallucinations.  Mood has been good.  Records have been reviewed since last visit.  She continues  to get injections faithfully for B12 deficiency.  She reports muscle/back pain.  The myalgias are in the arms and legs.  She does think that she did better when she was going to PT than now.  10/28/17 update: Patient is seen today in follow-up for Parkinson's disease.  This patient is accompanied in the office by her daughter who supplements the history.  Last visit, I told her to increase her carbidopa/levodopa 25/100 to 1 tablet 3 times per day (8am/noon/5pm).  Daughter not sure that it helps but "I don't see any regression."  She is going to PT.    She did that.  She is doing better.  She had a fall a few months ago.   She was in the garden in mulch and fell in it.  Daughter worried about safety in the home when husband gone as patient walks up and down basement stairs for exercise (and laundry).     No lightheadedness or near syncope.  No hallucinations.  03/31/18 update: Patient seen today in follow-up for Parkinson's disease, accompanied by her daughter who supplements history.  She is on carbidopa/levodopa 25/100, 1 tablet 3 times per day.  She has had 4 falls in December/Jan.  With 2 of them, she was taking ornaments off of the christmas tree.  With one of them she lost her balance coming out of the bathroom.  With one of them, she was coming up the basement step and lost balance once she got up to the top.  Once she falls, she cannot get up.  She hasn't fallen in February.  She hasn't been exercising since the falls because her lower L arm hurt and neck hurt, but that has gotten better.  She has her  laundry in the basement.  Daughter offered to move laundry upstairs but patient didn't want to do that.  Has lightheadedness when she quats but no near syncope.  Last visit, I rechecked her B12 level and it was high and we told her to stop her supplement and told her that we would recheck it in the future.  Outside reports reviewed: historical medical records, lab reports and office notes.  Allergies  Allergen Reactions  . Zetia [Ezetimibe]     Swelling   . Sulfa Antibiotics Rash    Outpatient Encounter Medications as of 03/31/2018  Medication Sig  . aspirin 81 MG tablet Take 81 mg by mouth daily.  . Biotin 1 MG CAPS Take by mouth.  . Calcium Carbonate (CALCIUM 600) 1500 MG TABS Take by mouth.  . carbidopa-levodopa (SINEMET IR) 25-100 MG tablet Take 1.5 tablets by mouth 3 (three) times daily.  . Cranberry 1000 MG CAPS Take by mouth.  . Garlic 10 MG CAPS Take by mouth.  . hydrochlorothiazide (HYDRODIURIL) 25 MG tablet Take 25 mg by mouth as needed.  . moexipril (UNIVASC) 7.5 MG tablet Take 7.5 mg by mouth at bedtime.  Marland Kitchen NIFEdipine (PROCARDIA-XL/ADALAT CC) 30 MG 24 hr tablet Take 30 mg by mouth daily.  . Omega-3 Fatty Acids (FISH OIL) 1000 MG CAPS Take 1 capsule by mouth 3 (three) times daily.  Marland Kitchen omeprazole (PRILOSEC) 40 MG capsule Take 40 mg by mouth daily.  . pravastatin (PRAVACHOL) 80 MG tablet Take 80 mg by mouth daily.  . [DISCONTINUED] carbidopa-levodopa (SINEMET IR) 25-100 MG tablet Take 1 tablet by mouth 3 (three) times daily.   No facility-administered encounter medications on file as of 03/31/2018.     Past Medical History:  Diagnosis Date  .  Coronary artery disease    post MI  . Esophageal reflux   . Hematuria   . Hyperlipidemia   . Hypertension   . MI (myocardial infarction) ([place])    age 48  . Osteoarthritis   . Osteopenia     Past Surgical History:  Procedure Laterality Date  . CATARACT EXTRACTION W/ INTRAOCULAR LENS  IMPLANT, BILATERAL    . DILATION AND  CURETTAGE OF UTERUS    . lid lift     bilaterally, lid lag  . NEPHROLITHOTOMY      Social History   Socioeconomic History  . Marital status: Married    Spouse name: Not on file  . Number of children: Not on file  . Years of education: Not on file  . Highest education level: Not on file  Occupational History    Employer: RETIRED    Comment: [employer]  Social Needs  . Financial resource strain: Not on file  . Food insecurity:    Worry: Not on file    Inability: Not on file  . Transportation needs:    Medical: Not on file    Non-medical: Not on file  Tobacco Use  . Smoking status: Former Smoker    Years: 30.00    Last attempt to quit: 10/28/2010    Years since quitting: 7.4  . Smokeless tobacco: Never Used  Substance and Sexual Activity  . Alcohol use: Yes    Comment: glass of wine once a month  . Drug use: No  . Sexual activity: Not on file  Lifestyle  . Physical activity:    Days per week: Not on file    Minutes per session: Not on file  . Stress: Not on file  Relationships  . Social connections:    Talks on phone: Not on file    Gets together: Not on file    Attends religious service: Not on file    Active member of club or organization: Not on file    Attends meetings of clubs or organizations: Not on file    Relationship status: Not on file  . Intimate partner violence:    Fear of current or ex partner: Not on file    Emotionally abused: Not on file    Physically abused: Not on file    Forced sexual activity: Not on file  Other Topics Concern  . Not on file  Social History Narrative  . Not on file    Family Status  Relation Name Status  . Mother  Deceased       Breast Cancer  . Father  Deceased at age 59       MI, CAD  . Sister ##Sister1 Alive       healthy  . MGM  Deceased       DM  . Daughter  Alive       healthy  . Daughter  Alive       healthy    Review of Systems Review of Systems  Constitutional: Negative.   HENT:  Negative.   Eyes: Negative.   Cardiovascular: Negative.   Gastrointestinal: Negative.   Genitourinary: Negative.   Skin: Negative.       Objective:   VITALS:   Vitals:   03/31/18 0821  BP: 124/66  Pulse: 70  SpO2: 94%  Weight: 149 lb (67.6 kg)  Height: 5\' 3"  (1.6 m)   GEN:  The patient appears stated age and is in NAD. HEENT:  Normocephalic, atraumatic.  The  mucous membranes are moist. The superficial temporal arteries are without ropiness or tenderness. CV:  RRR Lungs:  CTAB Neck/HEME:  There are no carotid bruits bilaterally.  Neurological examination:  Orientation: The patient is alert and oriented x3. Cranial nerves: There is good facial symmetry. The speech is fluent and clear. Soft palate rises symmetrically and there is no tongue deviation. Hearing is intact to conversational tone. Sensation: Sensation is intact to light touch throughout Motor: Strength is 5/5 in the bilateral upper and lower extremities.   Shoulder shrug is equal and symmetric.  There is no pronator drift.  MOVEMENT EXAM: Tone: no rigidity today Abnormal movements:  There is no postural tremor.  There is no resting tremor today when seated, even with distraction. Coordination:  There is only decremation with toe taps on the left (same as prior visits).  All other rapid alternating movements were normal today.  Gait and Station: Pt arises OOC without trouble.  She has re-emergent tremor in the UE bilaterally (same as prior).  She is not shuffling but she is a bit slow  Labs:    Lab work is received and was dated March 13, 2017.  Sodium was 141, potassium 4.3, chloride 106, CO2 27, BUN 21, creatinine 1.18.   Assessment/Plan:   1.  Parkinsons Disease, dx formally in 06/2016  -slightly increase carbidopa/levodopa 25/100, 1.5 tablets tid  -needs to get back to get back to exercise and I discussed this in detail.  -Talked extensively about safety in the home (same conversation as last visit).  Talked  again about having a life alert.    -encouraged patient to let daughter move laundry upstairs.  Don't want her go down in the basement due to safety issues  2.  Cerebral small vessel disease  -Relatively significant on her MRI of the brain.  She has a history of hypertension, hyperlipidemia, coronary artery disease and tobacco abuse hx.  She has quit smoking.  She will remain on aspirin.  Talked to her about risk factors associated with this degree of small vessel disease.  3.  PN, on examination with identified B12 deficiency  -changed her to oral b12 and will do level today.  4.  b12 deficiency  -Was doing injections, and then went to oral and level was still quite supratherapeutic.  We had her stop the supplement.  We will recheck the level today.  5.  lightheadedness  -suspect some OH.  Is on 3 BP meds and has an appt in AM with PCP.  Wonder if still needs all 3 and will discuss with PCP in AM at her appt.  Discussed concept of permissive HTN.  6.  Follow up is anticipated in the next few months, sooner should new neurologic issues arise.  Much greater than 50% of this visit was spent in counseling and coordinating care.  Total face to face time:  30 min

## 2018-03-31 ENCOUNTER — Encounter

## 2018-03-31 ENCOUNTER — Encounter: Payer: Self-pay | Admitting: Neurology

## 2018-03-31 ENCOUNTER — Other Ambulatory Visit (INDEPENDENT_AMBULATORY_CARE_PROVIDER_SITE_OTHER): Payer: Medicare HMO

## 2018-03-31 ENCOUNTER — Ambulatory Visit: Payer: Medicare HMO | Admitting: Neurology

## 2018-03-31 VITALS — BP 124/66 | HR 70 | Ht 63.0 in | Wt 149.0 lb

## 2018-03-31 DIAGNOSIS — G20 Parkinson's disease: Secondary | ICD-10-CM | POA: Diagnosis not present

## 2018-03-31 DIAGNOSIS — R42 Dizziness and giddiness: Secondary | ICD-10-CM | POA: Diagnosis not present

## 2018-03-31 DIAGNOSIS — E538 Deficiency of other specified B group vitamins: Secondary | ICD-10-CM

## 2018-03-31 MED ORDER — CARBIDOPA-LEVODOPA 25-100 MG PO TABS: 1.5000 | ORAL_TABLET | Freq: Three times a day (TID) | ORAL | 1 refills | Status: DC

## 2018-03-31 NOTE — Patient Instructions (Addendum)
1.  Increase carbidopa/levodopa 25/100 to 1.5 tablets three times per day 2.  Get a life alert 3.  Move your washer/dryer upstairs 4.  Talk to Dr. Brigitte Pulse about your BP medications and see if able to get off some of the medications or perhaps lowering the dose due to your lightheadedness 5.  We will check your B12 level today. Your provider has requested that you have labwork completed today. Please go to [facility] ([id number]) on the second floor of this building before leaving the office today. You do not need to check in. If you are not called within 15 minutes please check with the front desk.   The physicians and staff at [facility] are committed to providing excellent care. You may receive a survey requesting feedback about your experience at our office. We strive to receive "very good" responses to the survey questions. If you feel that your experience would prevent you from giving the office a "very good " response, please contact our office to try to remedy the situation. We may be reached at [phone]. Thank you for taking the time out of your busy day to complete the survey.

## 2018-04-01 DIAGNOSIS — I119 Hypertensive heart disease without heart failure: Secondary | ICD-10-CM | POA: Diagnosis not present

## 2018-04-01 DIAGNOSIS — M8580 Other specified disorders of bone density and structure, unspecified site: Secondary | ICD-10-CM | POA: Diagnosis not present

## 2018-04-01 DIAGNOSIS — I2510 Atherosclerotic heart disease of native coronary artery without angina pectoris: Secondary | ICD-10-CM | POA: Diagnosis not present

## 2018-04-01 DIAGNOSIS — Z0000 Encounter for general adult medical examination without abnormal findings: Secondary | ICD-10-CM | POA: Diagnosis not present

## 2018-04-01 DIAGNOSIS — E7800 Pure hypercholesterolemia, unspecified: Secondary | ICD-10-CM | POA: Diagnosis not present

## 2018-04-01 DIAGNOSIS — K219 Gastro-esophageal reflux disease without esophagitis: Secondary | ICD-10-CM | POA: Diagnosis not present

## 2018-04-01 DIAGNOSIS — G20 Parkinson's disease: Secondary | ICD-10-CM | POA: Diagnosis not present

## 2018-04-01 LAB — VITAMIN B12: Vitamin B-12: 1177 pg/mL — ABNORMAL HIGH (ref 200–1100)

## 2018-04-02 ENCOUNTER — Telehealth: Payer: Self-pay | Admitting: Neurology

## 2018-04-02 NOTE — Telephone Encounter (Signed)
-----   Message from [place], DO sent at 04/02/2018  7:46 AM EST ----- Let pt know that B12 is still high and doesn't need any supplement

## 2018-04-02 NOTE — Telephone Encounter (Signed)
Patient called back and was made aware of results and recommendations.

## 2018-04-02 NOTE — Telephone Encounter (Signed)
Left message on machine for patient to call back.

## 2018-04-14 ENCOUNTER — Ambulatory Visit: Payer: Medicare HMO | Admitting: Neurology

## 2018-04-15 DIAGNOSIS — R945 Abnormal results of liver function studies: Secondary | ICD-10-CM | POA: Diagnosis not present

## 2018-07-01 NOTE — Progress Notes (Signed)
Virtual Visit via Video Note The purpose of this virtual visit is to provide medical care while limiting exposure to the novel coronavirus.    Consent was obtained for video visit:  Yes.   Answered questions that patient had about telehealth interaction:  Yes.   I discussed the limitations, risks, security and privacy concerns of performing an evaluation and management service by telemedicine. I also discussed with the patient that there may be a patient responsible charge related to this service. The patient expressed understanding and agreed to proceed.  Pt location: Home Physician Location: office Name of referring provider:  Mayra Neer, MD I connected with Jacqueline Flores at patients initiation/request on 07/02/2018 at  1:30 PM EDT by video enabled telemedicine application and verified that I am speaking with the correct person using two identifiers. Pt MRN:  [id number] Pt DOB:  Jan 08, 1936 Video Participants:  Jacqueline Flores;  Daughter; husband   History of Present Illness:  Patient is seen today in follow-up for Parkinson's disease.  Last visit, I increased her carbidopa/levodopa 25/1 100 to 1.5 tablets 3 times per day.  She thinks that it has been helpful.  Does state that the L arm has been more painful in the shoulder than the right.  She has more trouble using it than the right.   Pt denies falls.   No hallucinations.  Mood has been good.  I asked her last visit to follow-up with her primary care physician to see if she needed all of her blood pressure medications.  She reports that one of the BP meds was stopped.  She is now only dizzy if she leans over.  In regards to her history of B12 deficiency, we rechecked her B12 last visit and it was good.  I told her she no longer needed the supplement.  She did move the washer/dryer upstairs but is going up and down the stairs for exercise when she doesn't think she is steady, and she thinks it will help to build her strength.  Her  daughter is frustrated with that.  Her daughter also mentions that the patients mood is not as good.  The patient states that she fights with her husband.     Current Outpatient Medications on File Prior to Visit  Medication Sig Dispense Refill  . aspirin 81 MG tablet Take 81 mg by mouth daily.    . Biotin 1 MG CAPS Take by mouth.    . Calcium Carbonate (CALCIUM 600) 1500 MG TABS Take by mouth.    . carbidopa-levodopa (SINEMET IR) 25-100 MG tablet Take 1.5 tablets by mouth 3 (three) times daily. 405 tablet 1  . Cranberry 1000 MG CAPS Take by mouth.    . Garlic 10 MG CAPS Take by mouth.    Marland Kitchen NIFEdipine (PROCARDIA-XL/ADALAT CC) 30 MG 24 hr tablet Take 30 mg by mouth daily.    . Omega-3 Fatty Acids (FISH OIL) 1000 MG CAPS Take 1 capsule by mouth 3 (three) times daily.    Marland Kitchen omeprazole (PRILOSEC) 40 MG capsule Take 40 mg by mouth daily.    . pravastatin (PRAVACHOL) 80 MG tablet Take 80 mg by mouth daily.     No current facility-administered medications on file prior to visit.      Observations/Objective:   Vitals:   07/02/18 1243  Weight: 150 lb (68 kg)  Height: 5\' 3"  (1.6 m)   GEN:  The patient appears stated age and is in NAD.  Her affect is less  jovial than usual  Neurological examination:  Orientation: The patient is alert and oriented x3. Cranial nerves: There is good facial symmetry. There is facial hypomimia.  The speech is fluent and clear. Soft palate rises symmetrically and there is no tongue deviation. Hearing is intact to conversational tone. Motor: Strength is at least antigravity x 4.   Shoulder shrug is equal and symmetric.  There is no pronator drift.  Movement examination: Tone: unable Abnormal movements: None seen, but I did not see her hands most of the visit at rest Coordination:  There is no decremation with RAM's, with hand opening and closing or finger taps bilaterally Gait and Station: The patient pushes off of the chair.  She is quite slow with short steps.   She has decreased arm swing bilaterally.    Assessment and Plan:   1.  Parkinsons Disease, dx formally in 06/2016             -Continue carbidopa/levodopa 25/100, 1.5 tabs tid             -Start carbidopa/levodopa 50/200 at bedtime.  The purpose of this is nighttime cramping as well as to help with first morning on, which seems to be an issue with her.             -Discussed safety in the home, which we have discussed the last several visits.  Asked her not to exercise on the stairs.  -We will refer for home physical and occupational therapy.  Is having significant difficulty washing her hair, some because of Parkinson's disease, some because of shoulder issues, and some because of what sounds like trauma as a kid and she does not like to get her face wet and now cannot make accommodations that she used to because of Parkinson's.  2.  Cerebral small vessel disease             -Relatively significant on her MRI of the brain.  She has a history of hypertension, hyperlipidemia, coronary artery disease and tobacco abuse hx.  She has quit smoking.  She will remain on aspirin.  Talked to her about risk factors associated with this degree of small vessel disease.  3.  PN,   -Understands that this can contribute to mild gait instability.  4.  b12 deficiency             -Was doing injections, and then went to oral and level was still quite supratherapeutic.  We had her stop the supplement and rechecked the level and it was still quite good and she is off of all supplementation now.  5.  lightheadedness             -suspect some OH.  Primary care has cut back her blood pressure medications and this has helped.  6.  Anxiety and depression  -Has declined medication today.  Was willing to start counseling.  Will refer to Myra Gianotti.  Follow Up Instructions:  4 to 5 months follow-up  -I discussed the assessment and treatment plan with the patient. The patient was provided an opportunity to ask  questions and all were answered. The patient agreed with the plan and demonstrated an understanding of the instructions.   The patient was advised to call back or seek an in-person evaluation if the symptoms worsen or if the condition fails to improve as anticipated.    Total Time spent in visit with the patient was: 26 minutes, of which more than 50% of the time was  spent in counseling, as above.   Pt understands and agrees with the plan of care outlined.     Alonza Bogus, DO

## 2018-07-02 ENCOUNTER — Encounter: Payer: Self-pay | Admitting: Neurology

## 2018-07-02 ENCOUNTER — Other Ambulatory Visit: Payer: Self-pay

## 2018-07-02 ENCOUNTER — Telehealth: Payer: Self-pay

## 2018-07-02 ENCOUNTER — Telehealth (INDEPENDENT_AMBULATORY_CARE_PROVIDER_SITE_OTHER): Payer: Medicare HMO | Admitting: Neurology

## 2018-07-02 DIAGNOSIS — G20 Parkinson's disease: Secondary | ICD-10-CM

## 2018-07-02 DIAGNOSIS — R69 Illness, unspecified: Secondary | ICD-10-CM | POA: Diagnosis not present

## 2018-07-02 DIAGNOSIS — F411 Generalized anxiety disorder: Secondary | ICD-10-CM

## 2018-07-02 MED ORDER — CARBIDOPA-LEVODOPA ER 50-200 MG PO TBCR: 1.0000 | EXTENDED_RELEASE_TABLET | Freq: Every day | ORAL | 1 refills | Status: DC

## 2018-07-02 NOTE — Telephone Encounter (Signed)
Both referrals has been placed for patient

## 2018-07-02 NOTE — Telephone Encounter (Signed)
-----   Message from [place], DO sent at 07/02/2018  1:50 PM EDT ----- Apple,  Please send a referral to [facility] for both PT and OT for PD.  Also, send referral to [facility] (different than [facility]) for Myra Gianotti (you can send her a community message but she needs a referral too) for counseling referral for anxiety/PD.  Hinton Dyer,  Please call pt for f/u in 4-5 months  Thank you both!

## 2018-07-26 ENCOUNTER — Ambulatory Visit: Payer: Self-pay | Admitting: Psychology

## 2018-07-28 ENCOUNTER — Encounter: Payer: Self-pay | Admitting: Neurology

## 2018-07-28 DIAGNOSIS — R29890 Loss of height: Secondary | ICD-10-CM | POA: Diagnosis not present

## 2018-07-28 DIAGNOSIS — Z1231 Encounter for screening mammogram for malignant neoplasm of breast: Secondary | ICD-10-CM | POA: Diagnosis not present

## 2018-07-28 DIAGNOSIS — Z8262 Family history of osteoporosis: Secondary | ICD-10-CM | POA: Diagnosis not present

## 2018-07-28 DIAGNOSIS — G20 Parkinson's disease: Secondary | ICD-10-CM | POA: Diagnosis not present

## 2018-07-28 DIAGNOSIS — Z780 Asymptomatic menopausal state: Secondary | ICD-10-CM | POA: Diagnosis not present

## 2018-07-28 DIAGNOSIS — M8589 Other specified disorders of bone density and structure, multiple sites: Secondary | ICD-10-CM | POA: Diagnosis not present

## 2018-07-28 DIAGNOSIS — Z803 Family history of malignant neoplasm of breast: Secondary | ICD-10-CM | POA: Diagnosis not present

## 2018-07-30 ENCOUNTER — Ambulatory Visit: Payer: Medicare HMO | Admitting: Neurology

## 2018-10-02 ENCOUNTER — Other Ambulatory Visit: Payer: Self-pay | Admitting: Neurology

## 2018-10-03 ENCOUNTER — Other Ambulatory Visit: Payer: Self-pay | Admitting: Neurology

## 2018-10-04 NOTE — Telephone Encounter (Signed)
Requested Prescriptions   Pending Prescriptions Disp Refills  . carbidopa-levodopa (SINEMET CR) 50-200 MG tablet [Pharmacy Med Name: CARBIDOPA-LEVODOPA ER 50-200 MG TAB] 90 tablet 1    Sig: TAKE ONE TABLET DAILY AT BEDTIME   Rx last filled: 07/02/18 #90 1 refills  Pt last seen: 07/02/18   Follow up appt scheduled: 12/09/18

## 2018-10-04 NOTE — Telephone Encounter (Signed)
Requested Prescriptions   Pending Prescriptions Disp Refills  . carbidopa-levodopa (SINEMET IR) 25-100 MG tablet [Pharmacy Med Name: CARBIDOPA-LEVODOPA 25-100 MG TAB] 405 tablet 1    Sig: TAKE 1 AND 1/2 TABLET 3 TIMES A DAY   Rx last filled: 03/31/18 #405 1 REFILLS  Pt last seen: 07/02/18   Follow up appt scheduled:12/09/18

## 2018-10-29 DIAGNOSIS — I119 Hypertensive heart disease without heart failure: Secondary | ICD-10-CM | POA: Diagnosis not present

## 2018-10-29 DIAGNOSIS — R42 Dizziness and giddiness: Secondary | ICD-10-CM | POA: Diagnosis not present

## 2018-10-29 DIAGNOSIS — G20 Parkinson's disease: Secondary | ICD-10-CM | POA: Diagnosis not present

## 2018-11-10 DIAGNOSIS — D1801 Hemangioma of skin and subcutaneous tissue: Secondary | ICD-10-CM | POA: Diagnosis not present

## 2018-11-10 DIAGNOSIS — L814 Other melanin hyperpigmentation: Secondary | ICD-10-CM | POA: Diagnosis not present

## 2018-11-10 DIAGNOSIS — L570 Actinic keratosis: Secondary | ICD-10-CM | POA: Diagnosis not present

## 2018-11-10 DIAGNOSIS — D485 Neoplasm of uncertain behavior of skin: Secondary | ICD-10-CM | POA: Diagnosis not present

## 2018-11-10 DIAGNOSIS — C44722 Squamous cell carcinoma of skin of right lower limb, including hip: Secondary | ICD-10-CM | POA: Diagnosis not present

## 2018-11-10 DIAGNOSIS — L821 Other seborrheic keratosis: Secondary | ICD-10-CM | POA: Diagnosis not present

## 2018-11-25 DIAGNOSIS — R69 Illness, unspecified: Secondary | ICD-10-CM | POA: Diagnosis not present

## 2018-12-07 ENCOUNTER — Telehealth: Payer: Self-pay | Admitting: Neurology

## 2018-12-07 NOTE — Telephone Encounter (Signed)
Spoke with Danne Harbor regarding patient she states that she wanted to update the office on patient prior to appt since they will not be with her.   Pt dizziness was addressed by PCP about 1 1/2 months ago. She was given Flonase, dramamine which has help.  Also she was referred to Velna Ochs for anxiety she never went to see her. Patient was confused when they called to schedule appt she made the appt but never went. She was charged a [employer]. She spoke with patient about the appt with Jessica Pt refused to be seen because she did not want to talk to anyone about her anxiety.  Pt has been driving occasionally only within 10 miles of home. They would like to know when she should stop driving?  Danne Harbor has been managing patient medication for her she is taken as order by provider.  She would like a print out of AVS with updates after her appt. On 12/09/18.

## 2018-12-07 NOTE — Telephone Encounter (Signed)
Patient's daughter, Danne Harbor, called requesting to speak with the nurse prior to the patient's appointment on 12/09/18 with Dr. Carles Collet. She said her and his sister cannot come to the appointment and she wants to give some new information to the nurse.

## 2018-12-08 NOTE — Progress Notes (Signed)
Jacqueline Flores was seen today in follow up for Parkinsons disease.   Pt reports that she has had 7-8 falls but "I think I have had an inner ear problem."  "I get up and lose my balance and fall."   Was referred last visit for PT but patient states that it didn't happen.  Unclear why it was.   Pt has had some lightheadedness.  Her daughter called yesterday and stated that her primary care gave her Dramamine recently for this.  She has had a history of this related to blood pressure medications.  She is still on nifedipine.   No hallucinations.  Mood has been good per patient, "unless I get upset."  She does have a hx of anxiety.  She declined medication last visit for this.  She was referred for counseling.  She did not go for that.  Her daughter called here 2 days ago about that stating that the patient did not want to talk to anybody about her anxiety.  Her daughter is now helping with some medication management.  Her daughter asked about how long she could continue to drive.  Current prescribed movement disorder medications: Carbidopa/levodopa 25/100, 1.5 tablets 3 times per day Carbidopa/levodopa 50/200 at bedtime (started last visit for cramping and first morning on)   ALLERGIES:   Allergies  Allergen Reactions  . Zetia [Ezetimibe]     Swelling   . Sulfa Antibiotics Rash    CURRENT MEDICATIONS:  Outpatient Encounter Medications as of 12/09/2018  Medication Sig  . aspirin 81 MG tablet Take 81 mg by mouth daily.  . Biotin 1 MG CAPS Take by mouth.  . Calcium Carbonate (CALCIUM 600) 1500 MG TABS Take by mouth.  . carbidopa-levodopa (SINEMET CR) 50-200 MG tablet TAKE ONE TABLET DAILY AT BEDTIME  . carbidopa-levodopa (SINEMET IR) 25-100 MG tablet TAKE 1 AND 1/2 TABLET 3 TIMES A DAY  . Cranberry 1000 MG CAPS Take by mouth.  . Garlic 10 MG CAPS Take by mouth.  Marland Kitchen NIFEdipine (PROCARDIA-XL/ADALAT CC) 30 MG 24 hr tablet Take 30 mg by mouth daily.  . Omega-3 Fatty Acids (FISH OIL) 1000 MG  CAPS Take 1 capsule by mouth 3 (three) times daily.  Marland Kitchen omeprazole (PRILOSEC) 40 MG capsule Take 40 mg by mouth daily.  . pravastatin (PRAVACHOL) 80 MG tablet Take 80 mg by mouth daily.   No facility-administered encounter medications on file as of 12/09/2018.     PAST MEDICAL HISTORY:   Past Medical History:  Diagnosis Date  . Coronary artery disease    post MI  . Esophageal reflux   . Hematuria   . Hyperlipidemia   . Hypertension   . MI (myocardial infarction) ([place])    age 20  . Osteoarthritis   . Osteopenia     PAST SURGICAL HISTORY:   Past Surgical History:  Procedure Laterality Date  . CATARACT EXTRACTION W/ INTRAOCULAR LENS  IMPLANT, BILATERAL    . DILATION AND CURETTAGE OF UTERUS    . lid lift     bilaterally, lid lag  . NEPHROLITHOTOMY      SOCIAL HISTORY:   Social History   Socioeconomic History  . Marital status: Married    Spouse name: Not on file  . Number of children: Not on file  . Years of education: Not on file  . Highest education level: Not on file  Occupational History    Employer: RETIRED    Comment: [employer]  Social Needs  .  Financial resource strain: Not on file  . Food insecurity    Worry: Not on file    Inability: Not on file  . Transportation needs    Medical: Not on file    Non-medical: Not on file  Tobacco Use  . Smoking status: Former Smoker    Years: 30.00    Quit date: 10/28/2010    Years since quitting: 8.1  . Smokeless tobacco: Never Used  Substance and Sexual Activity  . Alcohol use: Yes    Comment: glass of wine once a month  . Drug use: No  . Sexual activity: Not on file  Lifestyle  . Physical activity    Days per week: Not on file    Minutes per session: Not on file  . Stress: Not on file  Relationships  . Social Herbalist on phone: Not on file    Gets together: Not on file    Attends religious service: Not on file    Active member of club or organization: Not on file    Attends meetings  of clubs or organizations: Not on file    Relationship status: Not on file  . Intimate partner violence    Fear of current or ex partner: Not on file    Emotionally abused: Not on file    Physically abused: Not on file    Forced sexual activity: Not on file  Other Topics Concern  . Not on file  Social History Narrative  . Not on file    FAMILY HISTORY:   Family Status  Relation Name Status  . Mother  Deceased       Breast Cancer  . Father  Deceased at age 5       MI, CAD  . Sister  Alive       healthy  . MGM  Deceased       DM  . Daughter  Alive       healthy  . Daughter  Alive       healthy    ROS:  ROS  PHYSICAL EXAMINATION:    VITALS:   Vitals:   12/09/18 1522  Resp: 14  Weight: 138 lb 6.4 oz (62.8 kg)  Height: 5\' 2"  (1.575 m)   Orthostatic VS for the past 24 hrs (Last 3 readings):  BP- Lying Pulse- Lying BP- Sitting Pulse- Sitting BP- Standing at 0 minutes Pulse- Standing at 0 minutes BP- Standing at 3 minutes Pulse- Standing at 3 minutes  12/09/18 1539 - - - - - - 118/82 87  12/09/18 1527 148/80 73 134/90 78 126/84 81 - -     GEN:  The patient appears stated age and is in NAD.  Affect flat HEENT:  Normocephalic, atraumatic.  The mucous membranes are moist. The superficial temporal arteries are without ropiness or tenderness. CV:  RRR Lungs:  CTAB Neck/HEME:  There are no carotid bruits bilaterally.  Neurological examination:  Orientation:  [employer] Mental Exam 12/09/2018  Weekday Correct 1  Current year 1  What state are we in? 1  Amount spent 1  Amount left 2  # of Animals 1  5 objects recall 3  Number series 1  Hour markers 0  Time correct 0  Placed X in triangle correctly 1  Largest Figure 1  Name of female 2  Date back to work 2  Type of work 2  State she lived in 0  Total score 19   Cranial nerves:  There is good facial symmetry with facial hypomimia. The speech is fluent and clear. Soft palate rises symmetrically and  there is no tongue deviation. Hearing is intact to conversational tone. Sensation: Sensation is intact to light touch throughout Motor: Strength is at least antigravity x4.  Movement examination: Tone: There is normal tone in the UE/LE Abnormal movements: there is bilateral UE rest tremor with ambulation only Coordination:  There is  decremation with RAM's, with any form of RAMS, including alternating supination and pronation of the forearm, hand opening and closing, finger taps, heel taps and toe taps on the L (but there is some apraxia here as well) Gait and Station: The patient has no difficulty arising out of a deep-seated chair without the use of the hands. The patient's stride length is decreased with increased bilateral UE rest tremor.  She drags the L leg with ambulation.     ASSESSMENT/PLAN:  1. Parkinsons Disease, dx formally in 06/2016 -increase carbidopa/levodopa 25/100, 2 tablets three times per day (8am/noon/4pm) -continue carbidopa/levodopa 50/200 at bedtime.   -Discussed safety in the home, which we have discussed the last several visits.               -refer for PT due to multiple falls  2. Cerebral small vessel disease -Relatively significant on her MRI of the brain. She has a history of hypertension, hyperlipidemia, coronary artery disease and tobacco abuse hx. She has quit smoking. She will remain on aspirin. Talked to her about risk factors associated with this degree of small vessel disease.  3. PN,              -Understands that this can contribute to mild gait instability.  4. b12 deficiency -Was doing injections, and then went to oral and level was still quite supratherapeutic. We had her stop the supplement and rechecked the level and it was still quite good and she is off of all supplementation now.  5.lightheadedness -suspect some OH.  Primary care has cut back her blood  pressure medications but wonder if still an issue given lightheadedness.  Was somewhat orthostatic in office today but wouldn't want to add medication to raise BP as lying and sitting were on higher end  6.  Anxiety and depression             -denies but think that she is, as has long hx of such.  Set her up for appt for counseling with my social worker for PD  7.  Memory change  -suspect PDD  -refer to Dr. Melvyn Novas  -hold driving  8.  Follow up is anticipated in the next 4-6 months, sooner should new neurologic issues arise.  Much greater than 50% of this visit was spent in counseling and coordinating care.  Total face to face time:  40 min  Cc:  Mayra Neer, MD

## 2018-12-09 ENCOUNTER — Ambulatory Visit (INDEPENDENT_AMBULATORY_CARE_PROVIDER_SITE_OTHER): Payer: Medicare HMO | Admitting: Neurology

## 2018-12-09 ENCOUNTER — Other Ambulatory Visit: Payer: Self-pay

## 2018-12-09 ENCOUNTER — Encounter: Payer: Self-pay | Admitting: Neurology

## 2018-12-09 VITALS — Resp 14 | Ht 62.0 in | Wt 138.4 lb

## 2018-12-09 DIAGNOSIS — G903 Multi-system degeneration of the autonomic nervous system: Secondary | ICD-10-CM | POA: Diagnosis not present

## 2018-12-09 DIAGNOSIS — G20 Parkinson's disease: Secondary | ICD-10-CM | POA: Diagnosis not present

## 2018-12-09 DIAGNOSIS — R413 Other amnesia: Secondary | ICD-10-CM | POA: Diagnosis not present

## 2018-12-09 DIAGNOSIS — F411 Generalized anxiety disorder: Secondary | ICD-10-CM

## 2018-12-09 DIAGNOSIS — R69 Illness, unspecified: Secondary | ICD-10-CM | POA: Diagnosis not present

## 2018-12-09 NOTE — Patient Instructions (Addendum)
1.  Increase carbidopa/levodopa 25/100, 2 tablets at 8am/noon/4pm 2.  Continue carbidopa/levodopa 50/200 at bedtime (that one is for the cramping)  3.  You have been referred for a neurocognitive evaluation in our office.   The evaluation has two parts.   . The first part of the evaluation is a clinical interview with the neuropsychologist (Dr. Melvyn Novas or Dr. Nicole Kindred). Please bring someone with you to this appointment if possible, as it is helpful for the doctor to hear from both you and another adult who knows you well.   . The second part of the evaluation is testing with the doctor's technician Hinton Dyer or Maudie Mercury). The testing includes a variety of tasks- mostly question-and-answer, some paper-and-pencil. There is nothing you need to do to prepare for this appointment, but having a good night's sleep prior to the testing, taking medications as you normally would, and bringing eyeglasses and hearing aids (if you wear them), is advised. Please make sure that you wear a mask to the appointment.  Please note: We have to reserve several hours of the neuropsychologist's time and the psychometrician's time for your evaluation appointment. As such, please note that there is a No-Show fee of $100. If you are unable to attend any of your appointments, please contact our office as soon as possible to reschedule.   4.  I would not recommend that dramamine.  It can increase confusion and cause more falls.  5.  I sent an order for Physical therapy in [place]  6.  I want you to hold on driving until you meet with Dr. Melvyn Novas for the cognitive testing.  He will give you more information then.  There is a medical evaluation for driving via [facility] that we can schedule if necessary.  The physicians and staff at [facility] are committed to providing excellent care. You may receive a survey requesting feedback about your experience at our office. We strive to receive "very good" responses to the survey questions. If you  feel that your experience would prevent you from giving the office a "very good " response, please contact our office to try to remedy the situation. We may be reached at [phone]. Thank you for taking the time out of your busy day to complete the survey.

## 2018-12-28 ENCOUNTER — Telehealth: Payer: Self-pay | Admitting: Clinical

## 2018-12-28 NOTE — Telephone Encounter (Signed)
LCSW spoke with pt's daughter. Daughter unavailable to assist pt at 12/4 virtual visit so rescheduled to 12/9. Daughter discussed pt's hesitancy around discussing anxiety and insistence that she's "not crazy". LCSW discussed anxiety as common in association with PD and assured daughter of medical model, not-stigmatizing approach. Also informed daughter that there will be a bill for visit as daughter states pt complains of paying medical bills, daughter describes pt non-adherence to PT, pt also was suppose to see Janett Billow in the past but missed appt. Daughter phone is [phone]

## 2019-01-07 ENCOUNTER — Institutional Professional Consult (permissible substitution): Payer: Medicare HMO | Admitting: Clinical

## 2019-01-12 ENCOUNTER — Institutional Professional Consult (permissible substitution): Payer: Medicare HMO | Admitting: Clinical

## 2019-01-25 ENCOUNTER — Other Ambulatory Visit: Payer: Self-pay

## 2019-01-25 ENCOUNTER — Ambulatory Visit: Payer: MEDICARE | Attending: Family Medicine

## 2019-01-25 DIAGNOSIS — Z20828 Contact with and (suspected) exposure to other viral communicable diseases: Secondary | ICD-10-CM | POA: Diagnosis not present

## 2019-01-25 DIAGNOSIS — Z20822 Contact with and (suspected) exposure to covid-19: Secondary | ICD-10-CM

## 2019-01-26 LAB — NOVEL CORONAVIRUS, NAA: SARS-CoV-2, NAA: NOT DETECTED

## 2019-02-17 ENCOUNTER — Encounter: Payer: Medicare HMO | Admitting: Psychology

## 2019-02-27 ENCOUNTER — Other Ambulatory Visit: Payer: Self-pay | Admitting: Neurology

## 2019-02-28 ENCOUNTER — Encounter: Payer: Medicare HMO | Admitting: Psychology

## 2019-03-21 ENCOUNTER — Other Ambulatory Visit: Payer: Self-pay | Admitting: Neurology

## 2019-04-18 ENCOUNTER — Telehealth: Payer: Self-pay | Admitting: Neurology

## 2019-04-18 NOTE — Telephone Encounter (Signed)
Dr Tat should she contact the provider who is prescribing the bp medication. And does the patient need to be sooner, I didn't think so but I wanted to check with you?4

## 2019-04-18 NOTE — Telephone Encounter (Signed)
Patient's daughter DeLee called in regarding her mom and her having some dizzy spells and feeling shaky. Her other daughter who is a Marine scientist checked her BP this weekend and it was around 110/60. They remember Dr. Carles Collet saying she rather it be higher than too low. So her daughter took her off the BP medication. They have not noticed any positive side effects. Her daughter would like to know should she be seen sooner than 48/21? Please Call. Thank you

## 2019-04-19 DIAGNOSIS — Z0000 Encounter for general adult medical examination without abnormal findings: Secondary | ICD-10-CM | POA: Diagnosis not present

## 2019-04-19 DIAGNOSIS — R4189 Other symptoms and signs involving cognitive functions and awareness: Secondary | ICD-10-CM | POA: Diagnosis not present

## 2019-04-19 DIAGNOSIS — E7800 Pure hypercholesterolemia, unspecified: Secondary | ICD-10-CM | POA: Diagnosis not present

## 2019-04-19 DIAGNOSIS — G20 Parkinson's disease: Secondary | ICD-10-CM | POA: Diagnosis not present

## 2019-04-19 DIAGNOSIS — I119 Hypertensive heart disease without heart failure: Secondary | ICD-10-CM | POA: Diagnosis not present

## 2019-04-19 DIAGNOSIS — I2510 Atherosclerotic heart disease of native coronary artery without angina pectoris: Secondary | ICD-10-CM | POA: Diagnosis not present

## 2019-04-19 DIAGNOSIS — R296 Repeated falls: Secondary | ICD-10-CM | POA: Diagnosis not present

## 2019-04-19 DIAGNOSIS — Z7189 Other specified counseling: Secondary | ICD-10-CM | POA: Diagnosis not present

## 2019-04-19 DIAGNOSIS — M8580 Other specified disorders of bone density and structure, unspecified site: Secondary | ICD-10-CM | POA: Diagnosis not present

## 2019-04-19 DIAGNOSIS — K219 Gastro-esophageal reflux disease without esophagitis: Secondary | ICD-10-CM | POA: Diagnosis not present

## 2019-04-19 NOTE — Telephone Encounter (Signed)
See last note.  I did comment on the BP med because primary care has her on BP meds and was working to decrease it.  They should first see PCP.  Also, the number ONE thing that they can do for BP right now is hydrate - drink water!  Compression stockings can help as well.

## 2019-04-19 NOTE — Telephone Encounter (Signed)
Spoke with patient's daughter DeLee, and gave her Dr Tat recommendations. She voiced understanding but had a few more blood pressure questions. I advised her to address those with her moms pcp. She stated she her mom has not had as many falls as before, but sometimes she falls and has trouble getting up. She repeated Dr Doristine Devoid recommendations and said she would ask her remaining questions at her moms follow up appt in April.

## 2019-04-20 DIAGNOSIS — Z961 Presence of intraocular lens: Secondary | ICD-10-CM | POA: Diagnosis not present

## 2019-04-20 DIAGNOSIS — R296 Repeated falls: Secondary | ICD-10-CM | POA: Diagnosis not present

## 2019-04-20 DIAGNOSIS — H04123 Dry eye syndrome of bilateral lacrimal glands: Secondary | ICD-10-CM | POA: Diagnosis not present

## 2019-04-20 DIAGNOSIS — E7800 Pure hypercholesterolemia, unspecified: Secondary | ICD-10-CM | POA: Diagnosis not present

## 2019-04-20 DIAGNOSIS — R319 Hematuria, unspecified: Secondary | ICD-10-CM | POA: Diagnosis not present

## 2019-04-20 DIAGNOSIS — I119 Hypertensive heart disease without heart failure: Secondary | ICD-10-CM | POA: Diagnosis not present

## 2019-04-20 DIAGNOSIS — R8290 Unspecified abnormal findings in urine: Secondary | ICD-10-CM | POA: Diagnosis not present

## 2019-04-20 DIAGNOSIS — H40051 Ocular hypertension, right eye: Secondary | ICD-10-CM | POA: Diagnosis not present

## 2019-04-25 ENCOUNTER — Telehealth: Payer: Self-pay | Admitting: Neurology

## 2019-04-25 NOTE — Telephone Encounter (Signed)
Lab results received from primary care and related March 8.  Sodium was 140, potassium 4.0, chloride 101, CO2 30, BUN 16, creatinine 1.09, AST 19, ALT less than 3, alkaline phosphatase 127, white blood cell 6.0, hemoglobin 14.6, hematocrit 44.1 and platelets 235.  TSH was elevated at 5.01.  Primary care was going to repeat it in a few weeks.  She was also going to repeat the patient's urinalysis and add a culture, as there was trace amount of blood and trace leukocytes.  Patient was complaining about more falls.

## 2019-05-05 DIAGNOSIS — R946 Abnormal results of thyroid function studies: Secondary | ICD-10-CM | POA: Diagnosis not present

## 2019-05-10 NOTE — Progress Notes (Signed)
Assessment/Plan:   1.  Parkinsons Disease  -continue carbidopa/levodopa 25/100, 2 po tid  -Recommended walker at all times.  Pt declines  -recommended PT.  Pt declined even in home therapy  -long discussion about barriers to care as pt/family had multiple c/o but has declined everything that was recommended.  Don't want to see her suffering consequence of hip fractures due to falls. 2.  Elevated TSH  -Primary care following and going to repeat it in a few weeks.  Primary care was also going to repeat the patient's urinalysis and add culture, as it was noted that there was blood and trace leukocytes. 3.  Anxiety/depression  -Long history of this.  She has canceled multiple appointments for counseling.  States that she doesn't want to do this 4.  Memory change  -Appointments for neurocognitive testing have been canceled.  Suspect may be mild PDD  -Do not recommend driving. 5.  Lightheadedness  -Suspect some component of OH but daughter insists it isn't and states that granddaughter is a cardiologist and says it isn't.  If not, then I told them to f/u with PCP for further recommendations.  Pt does state that lightheadedness precedes taking medication in the AM so don't think related to carbidopa/levodopa. 6.  Follow up is anticipated in the next 4-6 months, sooner should new neurologic issues arise.    Subjective:   Jacqueline Flores was seen today in follow up for Parkinsons disease.  My previous records were reviewed prior to todays visit as well as outside records available to me.   Daughter present and supplements the history.  Pt states having fall 1 time every other week.  A few weeks ago she had a fall, but when she got back up and some trouble getting back up so her daughter in law had to come and get her back up.  Her BP was a bit low.   Daughter called me during that time about dizziness.  I told them to follow-up with primary care because she was still on some blood pressure  medication and suspected that there was a component of orthostasis, and new her primary care was already working on this.  She did follow-up with primary care.  I saw that blood work was done.  They are going to repeat blood work in a few weeks as she had slightly elevated TSH and her urinalysis showed some blood and trace leukocytes.  We discussed neurocognitive testing last visit and I recommended that.  I also recommended that she not drive until she do that.  She canceled for neurocognitive testing, which was scheduled for November.  Likewise, she canceled appointments with my social worker for counseling for anxiety.   Current prescribed movement disorder medications: Carbidopa/levodopa 25/100, 2 tablets 3 times per day (increased last visit) Carbidopa/levodopa 50/200 at bedtime     ALLERGIES:   Allergies  Allergen Reactions  . Zetia [Ezetimibe]     Swelling   . Sulfa Antibiotics Rash    CURRENT MEDICATIONS:  Outpatient Encounter Medications as of 05/12/2019  Medication Sig  . aspirin 81 MG tablet Take 81 mg by mouth daily.  . Biotin 1 MG CAPS Take by mouth.  . Calcium Carbonate (CALCIUM 600) 1500 MG TABS Take by mouth.  . carbidopa-levodopa (SINEMET CR) 50-200 MG tablet TAKE ONE TABLET DAILY AT BEDTIME  . carbidopa-levodopa (SINEMET IR) 25-100 MG tablet Take 1 tablet by mouth as directed. 2 tabs am, 2 tabs at lunch, 2 tabs at dinner  .  Cranberry 1000 MG CAPS Take by mouth.  . Garlic 10 MG CAPS Take by mouth.  Marland Kitchen NIFEdipine (PROCARDIA-XL/ADALAT CC) 30 MG 24 hr tablet Take 15 mg by mouth daily.   . Omega-3 Fatty Acids (FISH OIL) 1000 MG CAPS Take 1 capsule by mouth 3 (three) times daily.  Marland Kitchen omeprazole (PRILOSEC) 40 MG capsule Take 40 mg by mouth daily.  . pravastatin (PRAVACHOL) 80 MG tablet Take 80 mg by mouth daily.  . [DISCONTINUED] carbidopa-levodopa (SINEMET IR) 25-100 MG tablet TAKE 1 AND 1/2 TABLET 3 TIMES A DAY   No facility-administered encounter medications on file as of  05/12/2019.    Objective:   PHYSICAL EXAMINATION:    VITALS:   Vitals:   05/12/19 1531  BP: (!) 153/89  Pulse: 82  SpO2: 96%  Weight: 137 lb (62.1 kg)  Height: 5\' 2"  (1.575 m)    GEN:  The patient appears stated age and is in NAD. HEENT:  Normocephalic, atraumatic.  The mucous membranes are moist. The superficial temporal arteries are without ropiness or tenderness. CV:  RRR Lungs:  CTAB Neck/HEME:  There are no carotid bruits bilaterally.  Neurological examination:  Orientation: The patient is alert and oriented x3. Cranial nerves: There is good facial symmetry with min facial hypomimia. The speech is fluent and clear. Soft palate rises symmetrically and there is no tongue deviation. Hearing is intact to conversational tone. Sensation: Sensation is intact to light touch throughout Motor: Strength is at least antigravity x4.  Movement examination: Tone: There is normal tone in the normal Abnormal movements: there is occasional LUE resting tremor Coordination:  There is  decremation with RAM's, with toe taps Gait and Station: The patient pushes off of the chair.  She is unstable and somewhat ataxic    I have reviewed and interpreted the following labs independently Lab results received from primary care and dated March 8.  Sodium was 140, potassium 4.0, chloride 101, CO2 30, BUN 16, creatinine 1.09, AST 19, ALT less than 3, alkaline phosphatase 127, white blood cell 6.0, hemoglobin 14.6, hematocrit 44.1 and platelets 235.  TSH was elevated at 5.01.  Noted that primary care was going to repeat this in a few weeks and repeat the patient's urinalysis and add a culture, as there was a trace amount of blood and trace leukocytes.  Total time spent on today's visit was 80 minutes, including both face-to-face time and nonface-to-face time.  Time included that spent on review of records (prior notes available to me/labs/imaging if pertinent), discussing treatment and goals (55 min was  this), answering patient's questions and coordinating care.  Cc:  Mayra Neer, MD

## 2019-05-12 ENCOUNTER — Ambulatory Visit: Payer: Medicare HMO | Admitting: Neurology

## 2019-05-12 ENCOUNTER — Other Ambulatory Visit: Payer: Self-pay

## 2019-05-12 ENCOUNTER — Encounter: Payer: Self-pay | Admitting: Neurology

## 2019-05-12 VITALS — BP 153/89 | HR 82 | Ht 62.0 in | Wt 137.0 lb

## 2019-05-12 DIAGNOSIS — R413 Other amnesia: Secondary | ICD-10-CM

## 2019-05-12 DIAGNOSIS — F411 Generalized anxiety disorder: Secondary | ICD-10-CM

## 2019-05-12 DIAGNOSIS — G20 Parkinson's disease: Secondary | ICD-10-CM

## 2019-05-12 DIAGNOSIS — R69 Illness, unspecified: Secondary | ICD-10-CM | POA: Diagnosis not present

## 2019-05-12 DIAGNOSIS — R42 Dizziness and giddiness: Secondary | ICD-10-CM | POA: Diagnosis not present

## 2019-05-12 DIAGNOSIS — I10 Essential (primary) hypertension: Secondary | ICD-10-CM | POA: Insufficient documentation

## 2019-05-12 DIAGNOSIS — E785 Hyperlipidemia, unspecified: Secondary | ICD-10-CM | POA: Insufficient documentation

## 2019-05-12 DIAGNOSIS — K219 Gastro-esophageal reflux disease without esophagitis: Secondary | ICD-10-CM | POA: Insufficient documentation

## 2019-05-12 NOTE — Patient Instructions (Addendum)
Let us know if you change your mind on: 1.  PT 2.  Cognitive testing 3.  Social work/counseling 4.  rollator  The physicians and staff at [facility] are committed to providing excellent care. You may receive a survey requesting feedback about your experience at our office. We strive to receive "very good" responses to the survey questions. If you feel that your experience would prevent you from giving the office a "very good " response, please contact our office to try to remedy the situation. We may be reached at [phone]. Thank you for taking the time out of your busy day to complete the survey.

## 2019-06-07 DIAGNOSIS — Z85828 Personal history of other malignant neoplasm of skin: Secondary | ICD-10-CM | POA: Diagnosis not present

## 2019-06-07 DIAGNOSIS — I10 Essential (primary) hypertension: Secondary | ICD-10-CM | POA: Diagnosis not present

## 2019-06-07 DIAGNOSIS — R69 Illness, unspecified: Secondary | ICD-10-CM | POA: Diagnosis not present

## 2019-06-07 DIAGNOSIS — K219 Gastro-esophageal reflux disease without esophagitis: Secondary | ICD-10-CM | POA: Diagnosis not present

## 2019-06-07 DIAGNOSIS — Z9181 History of falling: Secondary | ICD-10-CM | POA: Diagnosis not present

## 2019-06-07 DIAGNOSIS — Z008 Encounter for other general examination: Secondary | ICD-10-CM | POA: Diagnosis not present

## 2019-06-07 DIAGNOSIS — I252 Old myocardial infarction: Secondary | ICD-10-CM | POA: Diagnosis not present

## 2019-06-07 DIAGNOSIS — Z87891 Personal history of nicotine dependence: Secondary | ICD-10-CM | POA: Diagnosis not present

## 2019-06-07 DIAGNOSIS — R269 Unspecified abnormalities of gait and mobility: Secondary | ICD-10-CM | POA: Diagnosis not present

## 2019-06-07 DIAGNOSIS — E785 Hyperlipidemia, unspecified: Secondary | ICD-10-CM | POA: Diagnosis not present

## 2019-06-07 DIAGNOSIS — G20 Parkinson's disease: Secondary | ICD-10-CM | POA: Diagnosis not present

## 2019-07-15 ENCOUNTER — Other Ambulatory Visit: Payer: Self-pay | Admitting: Neurology

## 2019-09-18 ENCOUNTER — Other Ambulatory Visit: Payer: Self-pay | Admitting: Neurology

## 2019-11-03 ENCOUNTER — Ambulatory Visit: Payer: Medicare HMO | Admitting: Neurology

## 2019-11-07 ENCOUNTER — Telehealth: Payer: Self-pay

## 2019-11-07 NOTE — Telephone Encounter (Signed)
Received call from patients daughter DeLee Mabe (on DPR) she states he mom has an appointment tomorrow. She states she does not know why her mom wants to keep the appt when she does not follow any of Dr Doristine Devoid instructions. She states Dr Tat has told the patient that there is no other medication she can try her on. She states patient wants to come in one more time to see if Dr Tat will tell her mom something different. She states that she is glad that Dr Tat is straightforward with her mom.   She states her mom decline home PT and is doing worse than she was before she last saw Dr Tat. She states the patient wants to hear Dr Tat say "there is nothing else she can do".   Daughter states she thinks tomorrows appointment is a waste of Dr Tat's time. She states she just wanted to give Dr Tat a heads up.

## 2019-11-07 NOTE — Progress Notes (Signed)
Assessment/Plan:   1.  Parkinsons Disease  -continue carbidopa/levodopa 25/100, 2 po tid  -take carbidopa/levodopa 50/200 q hs  -discussed that meds should be monitored             -Recommend ambulatory assistive device at all times, but patient declines.               -Recommended physical therapy, but patient declines.  Discussed in detail today             -long discussion about barriers to care as pt/family had multiple c/o but has declined everything that was recommended.  Don't want to see her suffering consequence of hip fractures due to falls. 2.  Anxiety/depression             -We have had multiple appointments for counseling, but patient has canceled them and states that she just is not going to do that. 3.   Memory change             -Appointments for neurocognitive testing have been canceled.  Suspect may be mild PDD             -Do not recommend driving. 4.  Lightheadedness             -Suspect some component of OH but daughter insists it isn't and states that granddaughter is a cardiologist and says it isn't.  If not, then I told them to f/u with PCP for further recommendations.  Pt does state that lightheadedness precedes taking medication in the AM so don't think related to carbidopa/levodopa. 5.  Dry eye  -likely due to Parkinsons Disease  -has been to optometry but told her may need to see ophthalmology   Subjective:   Jacqueline Flores was seen today in follow up for Parkinsons disease.  My previous records were reviewed prior to todays visit as well as outside records available to me. Pt with daughter who supplements hx.  Pt denies falls but her daughter tells her to be "truthful" and then she says "be truthful."  Last week, she was outside pulling weeds and ended up "sitting down (I.e. falling)."  Using walker out of the home but not in the home.  Pt denies lightheadedness, near syncope.  No hallucinations.  She is using stationary bike for 15 min intervals, up to a  total of 1 hour.  Daughter filling pill box and pt self admins.  Current prescribed movement disorder medications: Carbidopa/levodopa 25/100, 2 tablets 3 times per day Carbidopa/levodopa 50/200 at bedtime  ALLERGIES:   Allergies  Allergen Reactions  . Zetia [Ezetimibe]     Swelling   . Sulfa Antibiotics Rash    CURRENT MEDICATIONS:  Outpatient Encounter Medications as of 11/08/2019  Medication Sig  . aspirin 81 MG tablet Take 81 mg by mouth daily.  . Biotin 1 MG CAPS Take 1 capsule by mouth daily.   . Calcium Carbonate (CALCIUM 600) 1500 MG TABS Take 1,500 mg by mouth daily with breakfast.   . carbidopa-levodopa (SINEMET CR) 50-200 MG tablet TAKE ONE TABLET DAILY AT BEDTIME  . carbidopa-levodopa (SINEMET IR) 25-100 MG tablet TAKE 1 AND 1/2 TABLET 3 TIMES A DAY  . Cranberry 1000 MG CAPS Take by mouth daily.   . Garlic 10 MG CAPS Take by mouth daily.   Marland Kitchen NIFEdipine (PROCARDIA-XL/ADALAT CC) 30 MG 24 hr tablet Take 15 mg by mouth daily.   . Omega-3 Fatty Acids (FISH OIL) 1000 MG CAPS Take 1 capsule by  mouth 3 (three) times daily.  Marland Kitchen omeprazole (PRILOSEC) 40 MG capsule Take 40 mg by mouth daily.  . pravastatin (PRAVACHOL) 80 MG tablet Take 80 mg by mouth daily.   No facility-administered encounter medications on file as of 11/08/2019.    Objective:   PHYSICAL EXAMINATION:    VITALS:   Vitals:   11/08/19 1349  BP: (!) 143/82  Pulse: 74  SpO2: 97%  Weight: 137 lb (62.1 kg)  Height: 5\' 1"  (1.549 m)    GEN:  The patient appears stated age and is in NAD. HEENT:  Normocephalic, atraumatic.  The mucous membranes are moist. The superficial temporal arteries are without ropiness or tenderness. CV:  RRR Lungs:  CTAB Neck/HEME:  There are no carotid bruits bilaterally.  Neurological examination:  Orientation: The patient is alert and oriented x3. Cranial nerves: There is good facial symmetry with facial hypomimia. The speech is fluent and clear. Soft palate rises symmetrically  and there is no tongue deviation. Hearing is intact to conversational tone. Sensation: Sensation is intact to light touch throughout Motor: Strength is at least antigravity x4.  Movement examination: Tone: There is normal tone in the UE Abnormal movements: there is rare tremor in the LUE Coordination:  There is  decremation with RAM's, with finger taps on the L Gait and Station: The patient has difficulty arising out of a deep-seated chair without the use of the hands. The patient's stride length is slightly decreased.   I have reviewed and interpreted the following labs independently    Chemistry      Component Value Date/Time   CREATININE 0.77 11/09/2013 0819   No results found for: CALCIUM, ALKPHOS, AST, ALT, BILITOT     Lab Results  Component Value Date   WBC 7.6 10/27/2013   HGB 15.0 10/27/2013   HCT 44.0 10/27/2013   MCV 94.4 10/27/2013   PLT 245 10/27/2013    Lab Results  Component Value Date   TSH 2.976 10/27/2013   I called to get most recent labs from PCP and they were sent yesterday and it was from 05/05/19 and it was just TSH 2.88 and FT4 1.07  Total time spent on today's visit was 30 minutes, including both face-to-face time and nonface-to-face time.  Time included that spent on review of records (prior notes available to me/labs/imaging if pertinent), discussing treatment and goals, answering patient's questions and coordinating care.  Cc:  Mayra Neer, MD

## 2019-11-08 ENCOUNTER — Ambulatory Visit: Payer: Medicare HMO | Admitting: Neurology

## 2019-11-08 ENCOUNTER — Encounter: Payer: Self-pay | Admitting: Neurology

## 2019-11-08 ENCOUNTER — Other Ambulatory Visit: Payer: Self-pay

## 2019-11-08 VITALS — BP 143/82 | HR 74 | Ht 61.0 in | Wt 137.0 lb

## 2019-11-08 DIAGNOSIS — G20 Parkinson's disease: Secondary | ICD-10-CM

## 2019-11-08 DIAGNOSIS — R413 Other amnesia: Secondary | ICD-10-CM | POA: Diagnosis not present

## 2019-11-08 DIAGNOSIS — R69 Illness, unspecified: Secondary | ICD-10-CM | POA: Diagnosis not present

## 2019-11-08 MED ORDER — CARBIDOPA-LEVODOPA 25-100 MG PO TABS: 2.0000 | ORAL_TABLET | Freq: Three times a day (TID) | ORAL | Status: DC

## 2019-11-08 NOTE — Patient Instructions (Addendum)
1.  Take carbidopa/levodopa 25/100, 2 tablets at 7am/11am/4pm 2.  Take carbidopa/levodopa 50/200 at bedtime 3.  I still recommend home PT - we would love to send them to the home to help you!  Let us know if you change your mind! 4.  You should follow up with [facility] ophthalmology.  Dr. Prudencio Burly and Dr. Valetta Close and others are great!  The physicians and staff at [facility] are committed to providing excellent care. You may receive a survey requesting feedback about your experience at our office. We strive to receive "very good" responses to the survey questions. If you feel that your experience would prevent you from giving the office a "very good " response, please contact our office to try to remedy the situation. We may be reached at [phone]. Thank you for taking the time out of your busy day to complete the survey.

## 2019-12-04 ENCOUNTER — Other Ambulatory Visit: Payer: Self-pay | Admitting: Neurology

## 2019-12-23 ENCOUNTER — Telehealth: Payer: Self-pay | Admitting: Neurology

## 2019-12-23 NOTE — Telephone Encounter (Signed)
Patient's daughter called to let Dr Tat know that the patient decided to use her eye dr for the eye appointment Dr Tat was wanting her to have. She has an appointment scheduled with the [facility] on 12/26/19. They are needing the last office visit notes faxed to the office for her appointment. Fax #: [phone]. Patient is seeing Marilynne Halsted.

## 2019-12-23 NOTE — Telephone Encounter (Signed)
We need release to send records anywhere

## 2019-12-23 NOTE — Telephone Encounter (Signed)
Spoke with patients sister and informed her that she and her sister must sign a release in order for our office to send over any records.   She voiced understanding. And states she will figure something out.

## 2019-12-26 DIAGNOSIS — Z961 Presence of intraocular lens: Secondary | ICD-10-CM | POA: Diagnosis not present

## 2019-12-26 DIAGNOSIS — H04123 Dry eye syndrome of bilateral lacrimal glands: Secondary | ICD-10-CM | POA: Diagnosis not present

## 2019-12-26 DIAGNOSIS — H0288B Meibomian gland dysfunction left eye, upper and lower eyelids: Secondary | ICD-10-CM | POA: Diagnosis not present

## 2019-12-26 DIAGNOSIS — H0288A Meibomian gland dysfunction right eye, upper and lower eyelids: Secondary | ICD-10-CM | POA: Diagnosis not present

## 2019-12-27 ENCOUNTER — Other Ambulatory Visit: Payer: Self-pay | Admitting: Neurology

## 2020-02-08 DIAGNOSIS — L821 Other seborrheic keratosis: Secondary | ICD-10-CM | POA: Diagnosis not present

## 2020-02-08 DIAGNOSIS — Z85828 Personal history of other malignant neoplasm of skin: Secondary | ICD-10-CM | POA: Diagnosis not present

## 2020-02-08 DIAGNOSIS — L814 Other melanin hyperpigmentation: Secondary | ICD-10-CM | POA: Diagnosis not present

## 2020-03-28 ENCOUNTER — Other Ambulatory Visit: Payer: Self-pay | Admitting: Neurology

## 2020-03-28 NOTE — Telephone Encounter (Signed)
Rx(s) sent to pharmacy electronically.  

## 2020-04-08 DIAGNOSIS — W19XXXA Unspecified fall, initial encounter: Secondary | ICD-10-CM | POA: Diagnosis not present

## 2020-04-08 DIAGNOSIS — R42 Dizziness and giddiness: Secondary | ICD-10-CM | POA: Diagnosis not present

## 2020-04-08 DIAGNOSIS — R410 Disorientation, unspecified: Secondary | ICD-10-CM | POA: Diagnosis not present

## 2020-04-08 DIAGNOSIS — I959 Hypotension, unspecified: Secondary | ICD-10-CM | POA: Diagnosis not present

## 2020-04-19 ENCOUNTER — Other Ambulatory Visit: Payer: Self-pay | Admitting: Neurology

## 2020-04-24 ENCOUNTER — Telehealth: Payer: Self-pay

## 2020-04-24 NOTE — Telephone Encounter (Signed)
Let pt know that we will call her in 90 day supply (please do that) but that's all for now.  She cx her April appt.  Ask sarah to put on cx list please.

## 2020-04-25 NOTE — Telephone Encounter (Signed)
Close encounter 

## 2020-04-26 DIAGNOSIS — G20 Parkinson's disease: Secondary | ICD-10-CM | POA: Diagnosis not present

## 2020-04-26 DIAGNOSIS — K219 Gastro-esophageal reflux disease without esophagitis: Secondary | ICD-10-CM | POA: Diagnosis not present

## 2020-04-26 DIAGNOSIS — R296 Repeated falls: Secondary | ICD-10-CM | POA: Diagnosis not present

## 2020-04-26 DIAGNOSIS — E7800 Pure hypercholesterolemia, unspecified: Secondary | ICD-10-CM | POA: Diagnosis not present

## 2020-04-26 DIAGNOSIS — M8580 Other specified disorders of bone density and structure, unspecified site: Secondary | ICD-10-CM | POA: Diagnosis not present

## 2020-04-26 DIAGNOSIS — R4189 Other symptoms and signs involving cognitive functions and awareness: Secondary | ICD-10-CM | POA: Diagnosis not present

## 2020-04-26 DIAGNOSIS — Z0000 Encounter for general adult medical examination without abnormal findings: Secondary | ICD-10-CM | POA: Diagnosis not present

## 2020-04-26 DIAGNOSIS — I119 Hypertensive heart disease without heart failure: Secondary | ICD-10-CM | POA: Diagnosis not present

## 2020-04-26 DIAGNOSIS — E538 Deficiency of other specified B group vitamins: Secondary | ICD-10-CM | POA: Diagnosis not present

## 2020-04-26 DIAGNOSIS — I2510 Atherosclerotic heart disease of native coronary artery without angina pectoris: Secondary | ICD-10-CM | POA: Diagnosis not present

## 2020-04-26 DIAGNOSIS — R69 Illness, unspecified: Secondary | ICD-10-CM | POA: Diagnosis not present

## 2020-04-30 DIAGNOSIS — R69 Illness, unspecified: Secondary | ICD-10-CM | POA: Diagnosis not present

## 2020-04-30 DIAGNOSIS — E7800 Pure hypercholesterolemia, unspecified: Secondary | ICD-10-CM | POA: Diagnosis not present

## 2020-04-30 DIAGNOSIS — I2510 Atherosclerotic heart disease of native coronary artery without angina pectoris: Secondary | ICD-10-CM | POA: Diagnosis not present

## 2020-04-30 DIAGNOSIS — I252 Old myocardial infarction: Secondary | ICD-10-CM | POA: Diagnosis not present

## 2020-04-30 DIAGNOSIS — K219 Gastro-esophageal reflux disease without esophagitis: Secondary | ICD-10-CM | POA: Diagnosis not present

## 2020-04-30 DIAGNOSIS — G20 Parkinson's disease: Secondary | ICD-10-CM | POA: Diagnosis not present

## 2020-04-30 DIAGNOSIS — M8580 Other specified disorders of bone density and structure, unspecified site: Secondary | ICD-10-CM | POA: Diagnosis not present

## 2020-04-30 DIAGNOSIS — M1990 Unspecified osteoarthritis, unspecified site: Secondary | ICD-10-CM | POA: Diagnosis not present

## 2020-04-30 DIAGNOSIS — I119 Hypertensive heart disease without heart failure: Secondary | ICD-10-CM | POA: Diagnosis not present

## 2020-05-02 DIAGNOSIS — M8580 Other specified disorders of bone density and structure, unspecified site: Secondary | ICD-10-CM | POA: Diagnosis not present

## 2020-05-02 DIAGNOSIS — M1990 Unspecified osteoarthritis, unspecified site: Secondary | ICD-10-CM | POA: Diagnosis not present

## 2020-05-02 DIAGNOSIS — I2510 Atherosclerotic heart disease of native coronary artery without angina pectoris: Secondary | ICD-10-CM | POA: Diagnosis not present

## 2020-05-02 DIAGNOSIS — R69 Illness, unspecified: Secondary | ICD-10-CM | POA: Diagnosis not present

## 2020-05-02 DIAGNOSIS — E7800 Pure hypercholesterolemia, unspecified: Secondary | ICD-10-CM | POA: Diagnosis not present

## 2020-05-02 DIAGNOSIS — K219 Gastro-esophageal reflux disease without esophagitis: Secondary | ICD-10-CM | POA: Diagnosis not present

## 2020-05-02 DIAGNOSIS — I252 Old myocardial infarction: Secondary | ICD-10-CM | POA: Diagnosis not present

## 2020-05-02 DIAGNOSIS — I119 Hypertensive heart disease without heart failure: Secondary | ICD-10-CM | POA: Diagnosis not present

## 2020-05-02 DIAGNOSIS — G20 Parkinson's disease: Secondary | ICD-10-CM | POA: Diagnosis not present

## 2020-05-03 DIAGNOSIS — R69 Illness, unspecified: Secondary | ICD-10-CM | POA: Diagnosis not present

## 2020-05-03 DIAGNOSIS — E7800 Pure hypercholesterolemia, unspecified: Secondary | ICD-10-CM | POA: Diagnosis not present

## 2020-05-03 DIAGNOSIS — I2510 Atherosclerotic heart disease of native coronary artery without angina pectoris: Secondary | ICD-10-CM | POA: Diagnosis not present

## 2020-05-03 DIAGNOSIS — G20 Parkinson's disease: Secondary | ICD-10-CM | POA: Diagnosis not present

## 2020-05-03 DIAGNOSIS — M8580 Other specified disorders of bone density and structure, unspecified site: Secondary | ICD-10-CM | POA: Diagnosis not present

## 2020-05-03 DIAGNOSIS — M1990 Unspecified osteoarthritis, unspecified site: Secondary | ICD-10-CM | POA: Diagnosis not present

## 2020-05-03 DIAGNOSIS — K219 Gastro-esophageal reflux disease without esophagitis: Secondary | ICD-10-CM | POA: Diagnosis not present

## 2020-05-03 DIAGNOSIS — I119 Hypertensive heart disease without heart failure: Secondary | ICD-10-CM | POA: Diagnosis not present

## 2020-05-03 DIAGNOSIS — I252 Old myocardial infarction: Secondary | ICD-10-CM | POA: Diagnosis not present

## 2020-05-04 ENCOUNTER — Telehealth: Payer: Self-pay | Admitting: Neurology

## 2020-05-04 DIAGNOSIS — R69 Illness, unspecified: Secondary | ICD-10-CM | POA: Diagnosis not present

## 2020-05-04 DIAGNOSIS — I119 Hypertensive heart disease without heart failure: Secondary | ICD-10-CM | POA: Diagnosis not present

## 2020-05-04 DIAGNOSIS — M1990 Unspecified osteoarthritis, unspecified site: Secondary | ICD-10-CM | POA: Diagnosis not present

## 2020-05-04 DIAGNOSIS — I252 Old myocardial infarction: Secondary | ICD-10-CM | POA: Diagnosis not present

## 2020-05-04 DIAGNOSIS — G20 Parkinson's disease: Secondary | ICD-10-CM | POA: Diagnosis not present

## 2020-05-04 DIAGNOSIS — M8580 Other specified disorders of bone density and structure, unspecified site: Secondary | ICD-10-CM | POA: Diagnosis not present

## 2020-05-04 DIAGNOSIS — E7800 Pure hypercholesterolemia, unspecified: Secondary | ICD-10-CM | POA: Diagnosis not present

## 2020-05-04 DIAGNOSIS — I2510 Atherosclerotic heart disease of native coronary artery without angina pectoris: Secondary | ICD-10-CM | POA: Diagnosis not present

## 2020-05-04 DIAGNOSIS — K219 Gastro-esophageal reflux disease without esophagitis: Secondary | ICD-10-CM | POA: Diagnosis not present

## 2020-05-04 NOTE — Telephone Encounter (Signed)
Called patient's PCP, Dr Brigitte Pulse at [facility] and spoke with Santiago Glad. Relayed Dr Doristine Devoid message to let Dr Brigitte Pulse know that we have contacted patient's daughter, Mickey Farber, multiple times to offer earlier appointments than patient's scheduled August visit. On 05/02/20 patient's daughter requested to remove patient from waitlist and that they will just keep the August visit with Dr Tat. Santiago Glad stated she would relay the message back to Dr Brigitte Pulse.

## 2020-05-08 DIAGNOSIS — G20 Parkinson's disease: Secondary | ICD-10-CM | POA: Diagnosis not present

## 2020-05-08 DIAGNOSIS — I119 Hypertensive heart disease without heart failure: Secondary | ICD-10-CM | POA: Diagnosis not present

## 2020-05-08 DIAGNOSIS — I2510 Atherosclerotic heart disease of native coronary artery without angina pectoris: Secondary | ICD-10-CM | POA: Diagnosis not present

## 2020-05-08 DIAGNOSIS — M1990 Unspecified osteoarthritis, unspecified site: Secondary | ICD-10-CM | POA: Diagnosis not present

## 2020-05-08 DIAGNOSIS — R69 Illness, unspecified: Secondary | ICD-10-CM | POA: Diagnosis not present

## 2020-05-08 DIAGNOSIS — M8580 Other specified disorders of bone density and structure, unspecified site: Secondary | ICD-10-CM | POA: Diagnosis not present

## 2020-05-08 DIAGNOSIS — E7800 Pure hypercholesterolemia, unspecified: Secondary | ICD-10-CM | POA: Diagnosis not present

## 2020-05-08 DIAGNOSIS — I252 Old myocardial infarction: Secondary | ICD-10-CM | POA: Diagnosis not present

## 2020-05-08 DIAGNOSIS — K219 Gastro-esophageal reflux disease without esophagitis: Secondary | ICD-10-CM | POA: Diagnosis not present

## 2020-05-10 ENCOUNTER — Ambulatory Visit: Payer: Medicare HMO | Admitting: Neurology

## 2020-05-10 DIAGNOSIS — M1990 Unspecified osteoarthritis, unspecified site: Secondary | ICD-10-CM | POA: Diagnosis not present

## 2020-05-10 DIAGNOSIS — G20 Parkinson's disease: Secondary | ICD-10-CM | POA: Diagnosis not present

## 2020-05-10 DIAGNOSIS — I2510 Atherosclerotic heart disease of native coronary artery without angina pectoris: Secondary | ICD-10-CM | POA: Diagnosis not present

## 2020-05-10 DIAGNOSIS — I252 Old myocardial infarction: Secondary | ICD-10-CM | POA: Diagnosis not present

## 2020-05-10 DIAGNOSIS — M8580 Other specified disorders of bone density and structure, unspecified site: Secondary | ICD-10-CM | POA: Diagnosis not present

## 2020-05-10 DIAGNOSIS — R69 Illness, unspecified: Secondary | ICD-10-CM | POA: Diagnosis not present

## 2020-05-10 DIAGNOSIS — I119 Hypertensive heart disease without heart failure: Secondary | ICD-10-CM | POA: Diagnosis not present

## 2020-05-10 DIAGNOSIS — K219 Gastro-esophageal reflux disease without esophagitis: Secondary | ICD-10-CM | POA: Diagnosis not present

## 2020-05-10 DIAGNOSIS — E7800 Pure hypercholesterolemia, unspecified: Secondary | ICD-10-CM | POA: Diagnosis not present

## 2020-05-15 DIAGNOSIS — K219 Gastro-esophageal reflux disease without esophagitis: Secondary | ICD-10-CM | POA: Diagnosis not present

## 2020-05-15 DIAGNOSIS — I252 Old myocardial infarction: Secondary | ICD-10-CM | POA: Diagnosis not present

## 2020-05-15 DIAGNOSIS — I119 Hypertensive heart disease without heart failure: Secondary | ICD-10-CM | POA: Diagnosis not present

## 2020-05-15 DIAGNOSIS — G20 Parkinson's disease: Secondary | ICD-10-CM | POA: Diagnosis not present

## 2020-05-15 DIAGNOSIS — I2510 Atherosclerotic heart disease of native coronary artery without angina pectoris: Secondary | ICD-10-CM | POA: Diagnosis not present

## 2020-05-15 DIAGNOSIS — M8580 Other specified disorders of bone density and structure, unspecified site: Secondary | ICD-10-CM | POA: Diagnosis not present

## 2020-05-15 DIAGNOSIS — R69 Illness, unspecified: Secondary | ICD-10-CM | POA: Diagnosis not present

## 2020-05-15 DIAGNOSIS — E7800 Pure hypercholesterolemia, unspecified: Secondary | ICD-10-CM | POA: Diagnosis not present

## 2020-05-15 DIAGNOSIS — M1990 Unspecified osteoarthritis, unspecified site: Secondary | ICD-10-CM | POA: Diagnosis not present

## 2020-05-16 ENCOUNTER — Emergency Department (HOSPITAL_COMMUNITY): Payer: Medicare HMO

## 2020-05-16 ENCOUNTER — Encounter (HOSPITAL_COMMUNITY): Payer: Self-pay | Admitting: Emergency Medicine

## 2020-05-16 ENCOUNTER — Other Ambulatory Visit: Payer: Self-pay

## 2020-05-16 ENCOUNTER — Inpatient Hospital Stay (HOSPITAL_COMMUNITY)
Admission: EM | Admit: 2020-05-16 | Discharge: 2020-05-21 | DRG: 536 | Disposition: A | Discharge status: Skilled Nursing Facility | Payer: Medicare HMO | Attending: Family Medicine | Admitting: Family Medicine

## 2020-05-16 DIAGNOSIS — Z743 Need for continuous supervision: Secondary | ICD-10-CM | POA: Diagnosis not present

## 2020-05-16 DIAGNOSIS — H04123 Dry eye syndrome of bilateral lacrimal glands: Secondary | ICD-10-CM | POA: Diagnosis present

## 2020-05-16 DIAGNOSIS — I119 Hypertensive heart disease without heart failure: Secondary | ICD-10-CM | POA: Diagnosis present

## 2020-05-16 DIAGNOSIS — I951 Orthostatic hypotension: Secondary | ICD-10-CM | POA: Diagnosis not present

## 2020-05-16 DIAGNOSIS — Z87891 Personal history of nicotine dependence: Secondary | ICD-10-CM

## 2020-05-16 DIAGNOSIS — M8580 Other specified disorders of bone density and structure, unspecified site: Secondary | ICD-10-CM | POA: Diagnosis present

## 2020-05-16 DIAGNOSIS — S32512A Fracture of superior rim of left pubis, initial encounter for closed fracture: Secondary | ICD-10-CM | POA: Diagnosis not present

## 2020-05-16 DIAGNOSIS — R55 Syncope and collapse: Secondary | ICD-10-CM | POA: Diagnosis present

## 2020-05-16 DIAGNOSIS — S32591D Other specified fracture of right pubis, subsequent encounter for fracture with routine healing: Secondary | ICD-10-CM

## 2020-05-16 DIAGNOSIS — D72829 Elevated white blood cell count, unspecified: Secondary | ICD-10-CM | POA: Diagnosis present

## 2020-05-16 DIAGNOSIS — Z833 Family history of diabetes mellitus: Secondary | ICD-10-CM

## 2020-05-16 DIAGNOSIS — Y92008 Other place in unspecified non-institutional (private) residence as the place of occurrence of the external cause: Secondary | ICD-10-CM

## 2020-05-16 DIAGNOSIS — S32592D Other specified fracture of left pubis, subsequent encounter for fracture with routine healing: Secondary | ICD-10-CM

## 2020-05-16 DIAGNOSIS — S32591A Other specified fracture of right pubis, initial encounter for closed fracture: Principal | ICD-10-CM | POA: Diagnosis present

## 2020-05-16 DIAGNOSIS — S0990XA Unspecified injury of head, initial encounter: Secondary | ICD-10-CM | POA: Diagnosis not present

## 2020-05-16 DIAGNOSIS — M25572 Pain in left ankle and joints of left foot: Secondary | ICD-10-CM | POA: Diagnosis not present

## 2020-05-16 DIAGNOSIS — F32A Depression, unspecified: Secondary | ICD-10-CM | POA: Diagnosis present

## 2020-05-16 DIAGNOSIS — S32501D Unspecified fracture of right pubis, subsequent encounter for fracture with routine healing: Secondary | ICD-10-CM | POA: Diagnosis not present

## 2020-05-16 DIAGNOSIS — I2510 Atherosclerotic heart disease of native coronary artery without angina pectoris: Secondary | ICD-10-CM | POA: Diagnosis present

## 2020-05-16 DIAGNOSIS — G20 Parkinson's disease: Secondary | ICD-10-CM | POA: Diagnosis present

## 2020-05-16 DIAGNOSIS — N179 Acute kidney failure, unspecified: Secondary | ICD-10-CM | POA: Diagnosis present

## 2020-05-16 DIAGNOSIS — G8929 Other chronic pain: Secondary | ICD-10-CM | POA: Diagnosis present

## 2020-05-16 DIAGNOSIS — Z8249 Family history of ischemic heart disease and other diseases of the circulatory system: Secondary | ICD-10-CM

## 2020-05-16 DIAGNOSIS — Z66 Do not resuscitate: Secondary | ICD-10-CM | POA: Diagnosis present

## 2020-05-16 DIAGNOSIS — K219 Gastro-esophageal reflux disease without esophagitis: Secondary | ICD-10-CM | POA: Diagnosis present

## 2020-05-16 DIAGNOSIS — K5903 Drug induced constipation: Secondary | ICD-10-CM | POA: Diagnosis present

## 2020-05-16 DIAGNOSIS — Z7982 Long term (current) use of aspirin: Secondary | ICD-10-CM

## 2020-05-16 DIAGNOSIS — G894 Chronic pain syndrome: Secondary | ICD-10-CM

## 2020-05-16 DIAGNOSIS — Z803 Family history of malignant neoplasm of breast: Secondary | ICD-10-CM

## 2020-05-16 DIAGNOSIS — I252 Old myocardial infarction: Secondary | ICD-10-CM

## 2020-05-16 DIAGNOSIS — E785 Hyperlipidemia, unspecified: Secondary | ICD-10-CM | POA: Diagnosis present

## 2020-05-16 DIAGNOSIS — Z9181 History of falling: Secondary | ICD-10-CM

## 2020-05-16 DIAGNOSIS — Z20822 Contact with and (suspected) exposure to covid-19: Secondary | ICD-10-CM | POA: Diagnosis present

## 2020-05-16 DIAGNOSIS — Z882 Allergy status to sulfonamides status: Secondary | ICD-10-CM

## 2020-05-16 DIAGNOSIS — Z79899 Other long term (current) drug therapy: Secondary | ICD-10-CM

## 2020-05-16 DIAGNOSIS — M25551 Pain in right hip: Secondary | ICD-10-CM | POA: Diagnosis not present

## 2020-05-16 DIAGNOSIS — R0689 Other abnormalities of breathing: Secondary | ICD-10-CM | POA: Diagnosis not present

## 2020-05-16 DIAGNOSIS — S32502D Unspecified fracture of left pubis, subsequent encounter for fracture with routine healing: Secondary | ICD-10-CM | POA: Diagnosis not present

## 2020-05-16 DIAGNOSIS — M47816 Spondylosis without myelopathy or radiculopathy, lumbar region: Secondary | ICD-10-CM | POA: Diagnosis present

## 2020-05-16 DIAGNOSIS — R531 Weakness: Secondary | ICD-10-CM | POA: Diagnosis not present

## 2020-05-16 DIAGNOSIS — S32511A Fracture of superior rim of right pubis, initial encounter for closed fracture: Secondary | ICD-10-CM | POA: Diagnosis not present

## 2020-05-16 DIAGNOSIS — E876 Hypokalemia: Secondary | ICD-10-CM | POA: Diagnosis present

## 2020-05-16 DIAGNOSIS — R0902 Hypoxemia: Secondary | ICD-10-CM | POA: Diagnosis not present

## 2020-05-16 DIAGNOSIS — S32592A Other specified fracture of left pubis, initial encounter for closed fracture: Secondary | ICD-10-CM | POA: Diagnosis present

## 2020-05-16 DIAGNOSIS — Z888 Allergy status to other drugs, medicaments and biological substances status: Secondary | ICD-10-CM

## 2020-05-16 DIAGNOSIS — W19XXXA Unspecified fall, initial encounter: Secondary | ICD-10-CM | POA: Diagnosis present

## 2020-05-16 DIAGNOSIS — T402X5A Adverse effect of other opioids, initial encounter: Secondary | ICD-10-CM | POA: Diagnosis present

## 2020-05-16 LAB — COMPREHENSIVE METABOLIC PANEL
ALT: 6 U/L (ref 0–44)
AST: 23 U/L (ref 15–41)
Albumin: 3.9 g/dL (ref 3.5–5.0)
Alkaline Phosphatase: 96 U/L (ref 38–126)
Anion gap: 12 (ref 5–15)
BUN: 26 mg/dL — ABNORMAL HIGH (ref 8–23)
CO2: 25 mmol/L (ref 22–32)
Calcium: 9.1 mg/dL (ref 8.9–10.3)
Chloride: 99 mmol/L (ref 98–111)
Creatinine, Ser: 1.64 mg/dL — ABNORMAL HIGH (ref 0.44–1.00)
GFR, Estimated: 31 mL/min — ABNORMAL LOW (ref >60)
Glucose, Bld: 126 mg/dL — ABNORMAL HIGH (ref 70–99)
Potassium: 3.4 mmol/L — ABNORMAL LOW (ref 3.5–5.1)
Sodium: 136 mmol/L (ref 135–145)
Total Bilirubin: 0.7 mg/dL (ref 0.3–1.2)
Total Protein: 7.9 g/dL (ref 6.5–8.1)

## 2020-05-16 LAB — CBC
HCT: 41.1 % (ref 36.0–46.0)
Hemoglobin: 13 g/dL (ref 12.0–15.0)
MCH: 28.8 pg (ref 26.0–34.0)
MCHC: 31.6 g/dL (ref 30.0–36.0)
MCV: 90.9 fL (ref 80.0–100.0)
Platelets: 225 10*3/uL (ref 150–400)
RBC: 4.52 MIL/uL (ref 3.87–5.11)
RDW: 14.2 % (ref 11.5–15.5)
WBC: 13.2 10*3/uL — ABNORMAL HIGH (ref 4.0–10.5)
nRBC: 0 % (ref 0.0–0.2)

## 2020-05-16 MED ORDER — FENTANYL CITRATE (PF) 100 MCG/2ML IJ SOLN
50.0000 ug | Freq: Once | INTRAMUSCULAR | Status: AC
  Administered 2020-05-17: 50 ug via INTRAVENOUS
  Filled 2020-05-16: qty 2

## 2020-05-16 NOTE — ED Notes (Signed)
This NT and another NT attempted to ambulate the pt. Pt stood up and could not take a step. Pt placed back in her bed.

## 2020-05-16 NOTE — ED Notes (Signed)
Pt back from x ray, pt reports 5/10 pain, states that she doesn't need anything for pain at this time.

## 2020-05-16 NOTE — ED Triage Notes (Signed)
Pt fell and had loc. Pt now c/o right hip.

## 2020-05-16 NOTE — ED Provider Notes (Signed)
[facility] EMERGENCY DEPARTMENT Provider Note   CSN: [id number] Arrival date & time: 05/16/20  2136     History Chief Complaint  Patient presents with  . Loss of Consciousness    Jacqueline Flores is a 85 y.o. female.  HPI   This patient is a pleasant 85 year old female with a history of Parkinson's disease, history of hypertension and hyperlipidemia and a prior history of a myocardial infarction many years ago.  She presents from home after ringing her medical alert button when she had a fall.  She tells me that she was in her usual state of health trying to get to the bathroom but was not using her walker which she usually uses.  As she was walking around the bathroom she felt like her leg was giving out and she fell to the ground landing on her right side.  She was unable to get up from the ground which she states is normal for her when she does not have her walker.  She thinks that she did hit her head and does believe that she had a brief loss of consciousness.  She denies prodromal symptoms including chest pain palpitation shortness of breath lightheadedness dizziness or changes in vision.  She feels at this time that she is back to her baseline but does have pain when she tries to move around in her right lower back buttock and hip.  Paramedics found the patient with unremarkable vital signs, no leg length discrepancies.  Vital signs were reassuring.  She was transported immediately to the hospital for evaluation.  Past Medical History:  Diagnosis Date  . Coronary artery disease    post MI  . Esophageal reflux   . Hematuria   . Hyperlipidemia   . Hypertension   . MI (myocardial infarction) ([place])    age 89  . Osteoarthritis   . Osteopenia     Patient Active Problem List   Diagnosis Date Noted  . HLD (hyperlipidemia) 05/12/2019  . Hypertension 05/12/2019  . GERD (gastroesophageal reflux disease) 05/12/2019  . Ocular hypertension, right 04/20/2019  . Bilateral dry eyes  11/23/2013  . Pure hypercholesterolemia 03/10/2013  . Coronary atherosclerosis of unspecified type of vessel, native or graft 03/10/2013  . Esophageal reflux 03/10/2013  . Benign hypertensive heart disease without heart failure 03/10/2013  . Old myocardial infarction 03/10/2013  . Disorder of bone and cartilage, unspecified 03/10/2013  . 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 Pseudophakia, both eyes 12/01/2011  . Hordeolum 08/29/2011  . Dermatochalasis 11/29/2010    Past Surgical History:  Procedure Laterality Date  . CATARACT EXTRACTION W/ INTRAOCULAR LENS  IMPLANT, BILATERAL    . DILATION AND CURETTAGE OF UTERUS    . lid lift     bilaterally, lid lag  . NEPHROLITHOTOMY       OB History   No obstetric history on file.     Family History  Problem Relation Age of Onset  . Breast cancer Mother   . Heart attack Father   . CAD Father   . Diabetes Maternal Grandmother     Social History   Tobacco Use  . Smoking status: Former Smoker    Years: 30.00    Quit date: 10/28/2010    Years since quitting: 9.5  . Smokeless tobacco: Never Used  Vaping Use  . Vaping Use: Never used  Substance Use Topics  . Alcohol use: Yes    Comment: glass of wine once a month  . Drug use: No  Home Medications Prior to Admission medications   Medication Sig Start Date End Date Taking? Authorizing Provider  aspirin 81 MG tablet Take 81 mg by mouth daily.   Yes [provider]  Biotin 1 MG CAPS Take 1 capsule by mouth daily.    Yes [provider]  calcium carbonate (OSCAL) 1500 (600 Ca) MG TABS tablet Take 1,500 mg by mouth daily with breakfast.    Yes [provider]  calcium-vitamin D (OSCAL WITH D) 500-200 MG-UNIT tablet Take 1 tablet by mouth.   Yes [provider]  carbidopa-levodopa (SINEMET CR) 50-200 MG tablet TAKE ONE TABLET DAILY AT BEDTIME 03/28/20  Yes Tat, Rebecca S, DO  carbidopa-levodopa (SINEMET IR) 25-100 MG tablet TAKE 1 AND 1/2 TABLET 3 TIMES A DAY  04/24/20  Yes Tat, Rebecca S, DO  Garlic 10 MG CAPS Take by mouth daily.    Yes [provider]  Omega-3 Fatty Acids (FISH OIL) 1000 MG CAPS Take 1 capsule by mouth 3 (three) times daily.   Yes [provider]  omeprazole (PRILOSEC) 40 MG capsule Take 40 mg by mouth daily.   Yes [provider]  pravastatin (PRAVACHOL) 80 MG tablet Take 80 mg by mouth daily.   Yes [provider]  sertraline (ZOLOFT) 50 MG tablet Take 50 mg by mouth at bedtime.   Yes [provider]  TH VITAMIN E PO Take 670 mg by mouth.   Yes [provider]  vitamin C (ASCORBIC ACID) 500 MG tablet Take 500 mg by mouth daily.   Yes [provider]  Zinc 50 MG CAPS Take by mouth.   Yes [provider]  Cranberry 1000 MG CAPS Take by mouth daily.     [provider]  NIFEdipine (PROCARDIA-XL/ADALAT CC) 30 MG 24 hr tablet Take 15 mg by mouth daily.     [provider]    Allergies    Zetia [ezetimibe] and Sulfa antibiotics  Review of Systems   Review of Systems  All other systems reviewed and are negative.   Physical Exam Updated Vital Signs BP 122/74   Pulse 71   Temp 97.8 F (36.6 C)   Resp 18   Ht 1.549 m (5\' 1" )   Wt 62 kg   SpO2 96%   BMI 25.83 kg/m   Physical Exam Vitals and nursing note reviewed.  Constitutional:      General: She is not in acute distress.    Appearance: She is well-developed.  HENT:     Head: Normocephalic and atraumatic.     Comments: The head is palpated without tenderness or deformity    Nose: No congestion or rhinorrhea.     Mouth/Throat:     Mouth: Mucous membranes are moist.     Pharynx: No oropharyngeal exudate.     Comments: No trauma about the face, there is no malocclusion, the jaw bones are nontender Eyes:     General: No scleral icterus.       Right eye: No discharge.        Left eye: No discharge.     Conjunctiva/sclera: Conjunctivae normal.     Pupils: Pupils are equal,  round, and reactive to light.  Neck:     Thyroid: No thyromegaly.     Vascular: No JVD.  Cardiovascular:     Rate and Rhythm: Normal rate and regular rhythm.     Heart sounds: Murmur heard.  No friction rub. No gallop.  Comments: Soft systolic heart murmur Pulmonary:     Effort: Pulmonary effort is normal. No respiratory distress.     Breath sounds: Normal breath sounds. No wheezing or rales.  Abdominal:     General: Bowel sounds are normal. There is no distension.     Palpations: Abdomen is soft. There is no mass.     Tenderness: There is no abdominal tenderness.  Musculoskeletal:        General: Tenderness present. Normal range of motion.     Cervical back: Normal range of motion and neck supple.     Comments: There is no leg length discrepancies, the patient has normal appearing anatomy, she is able to straight leg raise a little bit bilaterally, left is easier than the right.  When I passively range the right hip knee and ankle there is no pain or tenderness, she does have mild pain with internal and external rotation of the right leg in the mid femur.  She has tenderness over the right sacroiliac joint, no tenderness over the lumbar spine  Lymphadenopathy:     Cervical: No cervical adenopathy.  Skin:    General: Skin is warm and dry.     Findings: No erythema or rash.  Neurological:     Mental Status: She is alert.     Coordination: Coordination normal.     Comments: She is able to feel light touch and pinprick in the bilateral lower extremities, she has normal strength in the bilateral upper extremities, mild tremor, normal mental status  Psychiatric:        Behavior: Behavior normal.     ED Results / Procedures / Treatments   Labs (all labs ordered are listed, but only abnormal results are displayed) Labs Reviewed - No data to display  EKG None  Radiology No results found.  Procedures Procedures   Medications Ordered in ED Medications - No data to  display  ED Course  I have reviewed the triage vital signs and the nursing notes.  Pertinent labs & imaging results that were available during my care of the patient were reviewed by me and considered in my medical decision making (see chart for details).    MDM Rules/Calculators/A&P                          The patient's cardiac and pulmonary exams are unremarkable, neurologically she seems to be baseline.  She does have some tenderness around the right leg and lower back which could be consistent with an injury such as a small fracture.  There is no obvious hip fracture on exam so imaging will be obtained.  We will also get an EKG and labs in case there was some other syncopal event leading up to the fall rather than a true mechanical fall.  Unfortunately it appears that the patient has pubic rami fractures of her pelvis.  The patient did not want to be admitted to the hospital but was unable to even get to a sitting position in the bed and family is unable to care for her, she will need physical therapy and rehabilitation and pain control, discussed with the hospitalist who will come to see for admission.  Final Clinical Impression(s) / ED Diagnoses Final diagnoses:  Syncope  Closed bilateral fracture of pubic rami with routine healing, subsequent encounter  Syncope and collapse  Chronic pain syndrome  At high risk for falls  Orthostatic hypotension    Rx / DC Orders  ED Discharge Orders    None       Noemi Chapel, MD 05/25/20 1705

## 2020-05-17 ENCOUNTER — Observation Stay (HOSPITAL_COMMUNITY): Payer: Medicare HMO

## 2020-05-17 ENCOUNTER — Inpatient Hospital Stay (HOSPITAL_COMMUNITY)
Admit: 2020-05-17 | Discharge: 2020-05-17 | Disposition: A | Discharge status: Home / Self Care | Payer: Medicare HMO | Attending: Family Medicine | Admitting: Family Medicine

## 2020-05-17 DIAGNOSIS — Z87891 Personal history of nicotine dependence: Secondary | ICD-10-CM | POA: Diagnosis not present

## 2020-05-17 DIAGNOSIS — Y92008 Other place in unspecified non-institutional (private) residence as the place of occurrence of the external cause: Secondary | ICD-10-CM | POA: Diagnosis not present

## 2020-05-17 DIAGNOSIS — M47816 Spondylosis without myelopathy or radiculopathy, lumbar region: Secondary | ICD-10-CM | POA: Diagnosis present

## 2020-05-17 DIAGNOSIS — S32592D Other specified fracture of left pubis, subsequent encounter for fracture with routine healing: Secondary | ICD-10-CM

## 2020-05-17 DIAGNOSIS — Z20822 Contact with and (suspected) exposure to covid-19: Secondary | ICD-10-CM | POA: Diagnosis not present

## 2020-05-17 DIAGNOSIS — I2510 Atherosclerotic heart disease of native coronary artery without angina pectoris: Secondary | ICD-10-CM | POA: Diagnosis present

## 2020-05-17 DIAGNOSIS — R55 Syncope and collapse: Secondary | ICD-10-CM | POA: Diagnosis not present

## 2020-05-17 DIAGNOSIS — I951 Orthostatic hypotension: Secondary | ICD-10-CM | POA: Diagnosis not present

## 2020-05-17 DIAGNOSIS — W19XXXA Unspecified fall, initial encounter: Secondary | ICD-10-CM | POA: Diagnosis not present

## 2020-05-17 DIAGNOSIS — K219 Gastro-esophageal reflux disease without esophagitis: Secondary | ICD-10-CM | POA: Diagnosis not present

## 2020-05-17 DIAGNOSIS — K5903 Drug induced constipation: Secondary | ICD-10-CM | POA: Diagnosis present

## 2020-05-17 DIAGNOSIS — N179 Acute kidney failure, unspecified: Secondary | ICD-10-CM | POA: Diagnosis not present

## 2020-05-17 DIAGNOSIS — I361 Nonrheumatic tricuspid (valve) insufficiency: Secondary | ICD-10-CM | POA: Diagnosis not present

## 2020-05-17 DIAGNOSIS — S32592A Other specified fracture of left pubis, initial encounter for closed fracture: Secondary | ICD-10-CM

## 2020-05-17 DIAGNOSIS — D72829 Elevated white blood cell count, unspecified: Secondary | ICD-10-CM | POA: Diagnosis present

## 2020-05-17 DIAGNOSIS — I10 Essential (primary) hypertension: Secondary | ICD-10-CM | POA: Diagnosis not present

## 2020-05-17 DIAGNOSIS — G8929 Other chronic pain: Secondary | ICD-10-CM | POA: Diagnosis present

## 2020-05-17 DIAGNOSIS — Z9181 History of falling: Secondary | ICD-10-CM | POA: Diagnosis not present

## 2020-05-17 DIAGNOSIS — T402X5A Adverse effect of other opioids, initial encounter: Secondary | ICD-10-CM | POA: Diagnosis present

## 2020-05-17 DIAGNOSIS — H04123 Dry eye syndrome of bilateral lacrimal glands: Secondary | ICD-10-CM | POA: Diagnosis not present

## 2020-05-17 DIAGNOSIS — S32591D Other specified fracture of right pubis, subsequent encounter for fracture with routine healing: Secondary | ICD-10-CM | POA: Diagnosis not present

## 2020-05-17 DIAGNOSIS — E876 Hypokalemia: Secondary | ICD-10-CM | POA: Diagnosis present

## 2020-05-17 DIAGNOSIS — G20 Parkinson's disease: Secondary | ICD-10-CM | POA: Diagnosis not present

## 2020-05-17 DIAGNOSIS — I252 Old myocardial infarction: Secondary | ICD-10-CM | POA: Diagnosis not present

## 2020-05-17 DIAGNOSIS — F32A Depression, unspecified: Secondary | ICD-10-CM | POA: Diagnosis present

## 2020-05-17 DIAGNOSIS — E785 Hyperlipidemia, unspecified: Secondary | ICD-10-CM | POA: Diagnosis not present

## 2020-05-17 DIAGNOSIS — M8580 Other specified disorders of bone density and structure, unspecified site: Secondary | ICD-10-CM | POA: Diagnosis present

## 2020-05-17 DIAGNOSIS — I6523 Occlusion and stenosis of bilateral carotid arteries: Secondary | ICD-10-CM | POA: Diagnosis not present

## 2020-05-17 DIAGNOSIS — S32591A Other specified fracture of right pubis, initial encounter for closed fracture: Secondary | ICD-10-CM | POA: Diagnosis not present

## 2020-05-17 DIAGNOSIS — I119 Hypertensive heart disease without heart failure: Secondary | ICD-10-CM | POA: Diagnosis not present

## 2020-05-17 DIAGNOSIS — Z882 Allergy status to sulfonamides status: Secondary | ICD-10-CM | POA: Diagnosis not present

## 2020-05-17 DIAGNOSIS — Z888 Allergy status to other drugs, medicaments and biological substances status: Secondary | ICD-10-CM | POA: Diagnosis not present

## 2020-05-17 DIAGNOSIS — Z66 Do not resuscitate: Secondary | ICD-10-CM | POA: Diagnosis not present

## 2020-05-17 LAB — CBC WITH DIFFERENTIAL/PLATELET
Abs Immature Granulocytes: 0.06 10*3/uL (ref 0.00–0.07)
Basophils Absolute: 0.1 10*3/uL (ref 0.0–0.1)
Basophils Relative: 0 %
Eosinophils Absolute: 0 10*3/uL (ref 0.0–0.5)
Eosinophils Relative: 0 %
HCT: 38.6 % (ref 36.0–46.0)
Hemoglobin: 12 g/dL (ref 12.0–15.0)
Immature Granulocytes: 1 %
Lymphocytes Relative: 10 %
Lymphs Abs: 1.1 10*3/uL (ref 0.7–4.0)
MCH: 28.3 pg (ref 26.0–34.0)
MCHC: 31.1 g/dL (ref 30.0–36.0)
MCV: 91 fL (ref 80.0–100.0)
Monocytes Absolute: 0.9 10*3/uL (ref 0.1–1.0)
Monocytes Relative: 8 %
Neutro Abs: 9.1 10*3/uL — ABNORMAL HIGH (ref 1.7–7.7)
Neutrophils Relative %: 81 %
Platelets: 188 10*3/uL (ref 150–400)
RBC: 4.24 MIL/uL (ref 3.87–5.11)
RDW: 14 % (ref 11.5–15.5)
WBC: 11.2 10*3/uL — ABNORMAL HIGH (ref 4.0–10.5)
nRBC: 0 % (ref 0.0–0.2)

## 2020-05-17 LAB — MAGNESIUM: Magnesium: 1.9 mg/dL (ref 1.7–2.4)

## 2020-05-17 LAB — COMPREHENSIVE METABOLIC PANEL
ALT: 7 U/L (ref 0–44)
AST: 19 U/L (ref 15–41)
Albumin: 3.5 g/dL (ref 3.5–5.0)
Alkaline Phosphatase: 91 U/L (ref 38–126)
Anion gap: 13 (ref 5–15)
BUN: 28 mg/dL — ABNORMAL HIGH (ref 8–23)
CO2: 23 mmol/L (ref 22–32)
Calcium: 9.1 mg/dL (ref 8.9–10.3)
Chloride: 102 mmol/L (ref 98–111)
Creatinine, Ser: 1.33 mg/dL — ABNORMAL HIGH (ref 0.44–1.00)
GFR, Estimated: 40 mL/min — ABNORMAL LOW (ref >60)
Glucose, Bld: 111 mg/dL — ABNORMAL HIGH (ref 70–99)
Potassium: 3.7 mmol/L (ref 3.5–5.1)
Sodium: 138 mmol/L (ref 135–145)
Total Bilirubin: 0.8 mg/dL (ref 0.3–1.2)
Total Protein: 6.9 g/dL (ref 6.5–8.1)

## 2020-05-17 LAB — SARS CORONAVIRUS 2 (TAT 6-24 HRS): SARS Coronavirus 2: NEGATIVE

## 2020-05-17 MED ORDER — CARBIDOPA-LEVODOPA 25-100 MG PO TABS
1.5000 | ORAL_TABLET | Freq: Three times a day (TID) | ORAL | Status: DC
  Administered 2020-05-17 – 2020-05-21 (×13): 1.5 via ORAL
  Filled 2020-05-17 (×13): qty 2

## 2020-05-17 MED ORDER — ACETAMINOPHEN 650 MG RE SUPP: 650.0000 mg | Freq: Four times a day (QID) | RECTAL | Status: DC

## 2020-05-17 MED ORDER — SERTRALINE HCL 50 MG PO TABS
50.0000 mg | ORAL_TABLET | Freq: Every day | ORAL | Status: DC
  Administered 2020-05-17 – 2020-05-20 (×4): 50 mg via ORAL
  Filled 2020-05-17 (×4): qty 1

## 2020-05-17 MED ORDER — FENTANYL CITRATE (PF) 100 MCG/2ML IJ SOLN
12.5000 ug | INTRAMUSCULAR | Status: DC | PRN
  Administered 2020-05-17: 50 ug via INTRAVENOUS
  Administered 2020-05-17: 12.5 ug via INTRAVENOUS
  Filled 2020-05-17 (×2): qty 2

## 2020-05-17 MED ORDER — ACETAMINOPHEN 325 MG PO TABS
650.0000 mg | ORAL_TABLET | Freq: Four times a day (QID) | ORAL | Status: DC
  Administered 2020-05-17 – 2020-05-21 (×14): 650 mg via ORAL
  Filled 2020-05-17 (×14): qty 2

## 2020-05-17 MED ORDER — FENTANYL CITRATE (PF) 100 MCG/2ML IJ SOLN: 25.0000 ug | INTRAMUSCULAR | Status: DC | PRN

## 2020-05-17 MED ORDER — ONDANSETRON HCL 4 MG PO TABS: 4.0000 mg | ORAL_TABLET | Freq: Four times a day (QID) | ORAL | Status: DC | PRN

## 2020-05-17 MED ORDER — PRAVASTATIN SODIUM 40 MG PO TABS
80.0000 mg | ORAL_TABLET | Freq: Every evening | ORAL | Status: DC
  Administered 2020-05-17 – 2020-05-20 (×4): 80 mg via ORAL
  Filled 2020-05-17 (×4): qty 2

## 2020-05-17 MED ORDER — SODIUM CHLORIDE 0.9 % IV SOLN: INTRAVENOUS | Status: DC

## 2020-05-17 MED ORDER — POTASSIUM CHLORIDE 20 MEQ PO PACK
40.0000 meq | PACK | Freq: Once | ORAL | Status: AC
  Administered 2020-05-17: 40 meq via ORAL
  Filled 2020-05-17: qty 2

## 2020-05-17 MED ORDER — ONDANSETRON HCL 4 MG/2ML IJ SOLN: 4.0000 mg | Freq: Four times a day (QID) | INTRAMUSCULAR | Status: DC | PRN

## 2020-05-17 MED ORDER — ACETAMINOPHEN 650 MG RE SUPP: 650.0000 mg | Freq: Four times a day (QID) | RECTAL | Status: DC | PRN

## 2020-05-17 MED ORDER — CARBIDOPA-LEVODOPA ER 50-200 MG PO TBCR
1.0000 | EXTENDED_RELEASE_TABLET | Freq: Every day | ORAL | Status: DC
  Administered 2020-05-17 – 2020-05-20 (×4): 1 via ORAL
  Filled 2020-05-17 (×6): qty 1

## 2020-05-17 MED ORDER — HEPARIN SODIUM (PORCINE) 5000 UNIT/ML IJ SOLN
5000.0000 [IU] | Freq: Three times a day (TID) | INTRAMUSCULAR | Status: DC
  Administered 2020-05-17 – 2020-05-21 (×13): 5000 [IU] via SUBCUTANEOUS
  Filled 2020-05-17 (×13): qty 1

## 2020-05-17 MED ORDER — OXYCODONE HCL 5 MG PO TABS: 5.0000 mg | ORAL_TABLET | ORAL | Status: DC | PRN

## 2020-05-17 MED ORDER — CYCLOBENZAPRINE HCL 10 MG PO TABS
5.0000 mg | ORAL_TABLET | Freq: Three times a day (TID) | ORAL | Status: DC | PRN
  Administered 2020-05-20: 5 mg via ORAL
  Filled 2020-05-17: qty 1

## 2020-05-17 MED ORDER — ACETAMINOPHEN 325 MG PO TABS: 650.0000 mg | ORAL_TABLET | Freq: Four times a day (QID) | ORAL | Status: DC | PRN

## 2020-05-17 MED ORDER — ASPIRIN EC 81 MG PO TBEC
81.0000 mg | DELAYED_RELEASE_TABLET | Freq: Every day | ORAL | Status: DC
  Administered 2020-05-17 – 2020-05-21 (×5): 81 mg via ORAL
  Filled 2020-05-17 (×5): qty 1

## 2020-05-17 NOTE — Procedures (Signed)
Patient Name: Jacqueline Flores  MRN: [id number]  Epilepsy Attending: Lora Havens  Referring Physician/Provider: Dr Irwin Brakeman Date: 05/17/2020  Duration: 23.30 mins  Patient history: 85yo F with syncope. EEG to evaluate for seizure  Level of alertness: Awake  AEDs during EEG study: None  Technical aspects: This EEG study was done with scalp electrodes positioned according to the 10-20 International system of electrode placement. Electrical activity was acquired at a sampling rate of 500Hz  and reviewed with a high frequency filter of 70Hz  and a low frequency filter of 1Hz . EEG data were recorded continuously and digitally stored.   Description: The posterior dominant rhythm consists of 7.5-8 Hz activity of moderate voltage (25-35 uV) seen predominantly in posterior head regions, symmetric and reactive to eye opening and eye closing. Continuous generalized 5-6hz  theta slowing was also noted. Hyperventilation and photic stimulation were not performed.     ABNORMALITY - Continuous slow, generalized  IMPRESSION: This study is suggestive of mild diffuse encephalopathy, nonspecific etiology. No seizures or epileptiform discharges were seen throughout the recording.  Jacqueline Flores

## 2020-05-17 NOTE — Evaluation (Signed)
Physical Therapy Evaluation Patient Details Name: KAMEELAH MINISH MRN: [id number] DOB: 1935-08-19 Today's Date: 05/17/2020   History of Present Illness  Rosey Eide  is a 85 y.o. female, with history of Parkinson's disease, myocardial infarction, hypertension, hyperlipidemia, GERD, [name] presents to the ED with a chief complaint of fall.  Patient is confused, waxing and waning confusion has been normal for her recently, so daughter provides most of history.  Daughter reports that patient has been having vagal syncopal episodes.  The last one was at the beginning of March.  Today patient hit her life alert button and when daughter got over there she was diaphoretic, and altered from baseline.  Daughter describes her as groggy, slumped over, mumbling incoherently.  Blood pressure was 110/78, but when they tried to stand her up it dropped.  Daughter reports it is normal for her to have these episodes after syncope where she is altered from baseline.  About an hour after her syncopal episode she was closer to her baseline mentation so family try to stand her up.  She complained that her right leg.  She could not weight-bear.  Is normal for her to have cramps in her legs but she is usually able to bear weight.  The decision was made to get patient to the ER.  Of note patient has no history of seizures.  Patient herself does not remember much of the event.  She reports that she was walking out of the bathroom with a cup of tea- which is abnormal and she is unable to explain-without her walker when she lost her balance and fell.  She thinks she hit her head.  She is not sure if she lost consciousness.  She not aware of any preceding symptoms, but overall is a difficult historian due to her confusion.  She was started on a new medication 3 weeks ago-Zoloft.  Her last syncopal episode was before the Zoloft was started.  Patient is a former smoker, does not drink alcohol, does not use illicit drugs.  She is  vaccinated for COVID.  Patient is DNR.    Clinical Impression  Patient demonstrates slow labored movement for sitting up at bedside due to discomfort pain bilateral groin area, at high risk for falls and limited to a few unsteady labored side steps at bedside with inability to fully extend knees due to increased groin pain.  Patient tolerated sitting up in chair after therapy with family members present in room - RN aware.  Patient will benefit from continued physical therapy in hospital and recommended venue below to increase strength, balance, endurance for safe ADLs and gait.      Follow Up Recommendations SNF    Equipment Recommendations  3in1 (PT)    Recommendations for Other Services       Precautions / Restrictions Precautions Precautions: Fall Restrictions Weight Bearing Restrictions: No      Mobility  Bed Mobility Overal bed mobility: Needs Assistance Bed Mobility: Supine to Sit     Supine to sit: Mod assist;Max assist     General bed mobility comments: increased time, labored movement    Transfers Overall transfer level: Needs assistance Equipment used: Rolling walker (2 wheeled) Transfers: Sit to/from [facility] Sit to Stand: Mod assist Stand pivot transfers: Mod assist       General transfer comment: very unsteady on feet with poor tolerance for weightbearing BLE due to increased groin pain  Ambulation/Gait Ambulation/Gait assistance: Mod assist;Max assist Gait Distance (Feet): 3 Feet Assistive  device: Rolling walker (2 wheeled) Gait Pattern/deviations: Decreased step length - right;Decreased step length - left;Decreased stride length Gait velocity: decreased   General Gait Details: limited to 3-4 slow labored side steps with diffiuclty fully extending knees due to severe bilateral groin pain  Stairs            Wheelchair Mobility    Modified Rankin (Stroke Patients Only)       Balance Overall balance assessment: Needs  assistance Sitting-balance support: Feet supported;Single extremity supported Sitting balance-Leahy Scale: Fair Sitting balance - Comments: fair/good seated at EOB with single UE support   Standing balance support: During functional activity;Bilateral upper extremity supported Standing balance-Leahy Scale: Poor Standing balance comment: using RW                             Pertinent Vitals/Pain Pain Assessment: Faces Faces Pain Scale: Hurts whole lot Pain Location: bilateral groin area when standing, taking steps Pain Descriptors / Indicators: Sore;Grimacing Pain Intervention(s): Limited activity within patient's tolerance;Monitored during session;Repositioned;Premedicated before session    Home Living Family/patient expects to be discharged to:: Private residence Living Arrangements: Spouse/significant other Available Help at Discharge: Family;Available PRN/intermittently Type of Home: House Home Access: Stairs to enter Entrance Stairs-Rails: None Entrance Stairs-Number of Steps: 2 Home Layout: One level;Laundry or work area in [place]: Environmental consultant - 2 wheels;Shower seat      Prior Function Level of Independence: Needs assistance   Gait / Transfers Assistance Needed: household ambulator using RW, supervised most of time  ADL's / Homemaking Assistance Needed: assisted by family        Hand Dominance        Extremity/Trunk Assessment   Upper Extremity Assessment Upper Extremity Assessment: Generalized weakness    Lower Extremity Assessment Lower Extremity Assessment: Generalized weakness    Cervical / Trunk Assessment Cervical / Trunk Assessment: Normal  Communication   Communication: No difficulties  Cognition Arousal/Alertness: Awake/alert Behavior During Therapy: WFL for tasks assessed/performed Overall Cognitive Status: Within Functional Limits for tasks assessed                                        General  Comments      Exercises     Assessment/Plan    PT Assessment Patient needs continued PT services  PT Problem List Decreased strength;Decreased activity tolerance;Decreased balance;Decreased mobility       PT Treatment Interventions DME instruction;Gait training;Stair training;Functional mobility training;Therapeutic activities;Therapeutic exercise;Patient/family education;Balance training    PT Goals (Current goals can be found in the Care Plan section)  Acute Rehab PT Goals Patient Stated Goal: return home after rehab PT Goal Formulation: With patient/family Time For Goal Achievement: 05/31/20 Potential to Achieve Goals: Good    Frequency Min 3X/week   Barriers to discharge        Co-evaluation               AM-PAC PT "6 Clicks" Mobility  Outcome Measure Help needed turning from your back to your side while in a flat bed without using bedrails?: A Lot Help needed moving from lying on your back to sitting on the side of a flat bed without using bedrails?: A Lot Help needed moving to and from a bed to a chair (including a wheelchair)?: A Lot Help needed standing up from a chair using your arms (e.g., wheelchair  or bedside chair)?: A Lot Help needed to walk in hospital room?: A Lot Help needed climbing 3-5 steps with a railing? : Total 6 Click Score: 11    End of Session   Activity Tolerance: Patient tolerated treatment well;Patient limited by fatigue;Patient limited by pain Patient left: in chair;with call bell/phone within reach;with chair alarm set;with family/visitor present Nurse Communication: Mobility status PT Visit Diagnosis: Unsteadiness on feet (R26.81);Other abnormalities of gait and mobility (R26.89);Muscle weakness (generalized) (M62.81)    Time: 6579-0383 PT Time Calculation (min) (ACUTE ONLY): 34 min   Charges:   PT Evaluation $PT Eval Moderate Complexity: 1 Mod PT Treatments $Therapeutic Activity: 23-37 mins        12:05 PM,  05/17/20 Lonell Grandchild, MPT Physical Therapist with [facility] [phone] office [phone] mobile phone

## 2020-05-17 NOTE — ED Notes (Signed)
Attempt to call report x1; they will call back

## 2020-05-17 NOTE — Plan of Care (Signed)
  Problem: Acute Rehab PT Goals(only PT should resolve) Goal: Pt Will Go Supine/Side To Sit Outcome: Progressing Flowsheets (Taken 05/17/2020 1206) Pt will go Supine/Side to Sit:  with minimal assist  with moderate assist Goal: Patient Will Transfer Sit To/From Stand Outcome: Progressing Flowsheets (Taken 05/17/2020 1206) Patient will transfer sit to/from stand:  with minimal assist  with moderate assist Goal: Pt Will Transfer Bed To Chair/Chair To Bed Outcome: Progressing Flowsheets (Taken 05/17/2020 1206) Pt will Transfer Bed to Chair/Chair to Bed: with mod assist Goal: Pt Will Ambulate Outcome: Progressing Flowsheets (Taken 05/17/2020 1206) Pt will Ambulate:  15 feet  with moderate assist  with rolling walker   12:07 PM, 05/17/20 Lonell Grandchild, MPT Physical Therapist with [facility] [phone] office [phone] mobile phone

## 2020-05-17 NOTE — Progress Notes (Signed)
ASSUMPTION OF CARE NOTE   05/17/2020 2:15 PM  Jacqueline Flores was seen and examined.  The H&P by the admitting provider, orders, imaging was reviewed.  Please see new orders.  Will continue to follow.   Vitals:   05/17/20 0610 05/17/20 1335  BP: (!) 163/87 140/73  Pulse: 80 80  Resp: (!) 22 19  Temp: 97.7 F (36.5 C) 97.8 F (36.6 C)  SpO2: 94% 95%    Results for orders placed or performed during the hospital encounter of 05/16/20  SARS CORONAVIRUS 2 (TAT 6-24 HRS) Nasopharyngeal Nasopharyngeal Swab   Specimen: Nasopharyngeal Swab  Result Value Ref Range   SARS Coronavirus 2 NEGATIVE NEGATIVE  CBC  Result Value Ref Range   WBC 13.2 (H) 4.0 - 10.5 K/uL   RBC 4.52 3.87 - 5.11 MIL/uL   Hemoglobin 13.0 12.0 - 15.0 g/dL   HCT 41.1 36.0 - 46.0 %   MCV 90.9 80.0 - 100.0 fL   MCH 28.8 26.0 - 34.0 pg   MCHC 31.6 30.0 - 36.0 g/dL   RDW 14.2 11.5 - 15.5 %   Platelets 225 150 - 400 K/uL   nRBC 0.0 0.0 - 0.2 %  Comprehensive metabolic panel  Result Value Ref Range   Sodium 136 135 - 145 mmol/L   Potassium 3.4 (L) 3.5 - 5.1 mmol/L   Chloride 99 98 - 111 mmol/L   CO2 25 22 - 32 mmol/L   Glucose, Bld 126 (H) 70 - 99 mg/dL   BUN 26 (H) 8 - 23 mg/dL   Creatinine, Ser 1.64 (H) 0.44 - 1.00 mg/dL   Calcium 9.1 8.9 - 10.3 mg/dL   Total Protein 7.9 6.5 - 8.1 g/dL   Albumin 3.9 3.5 - 5.0 g/dL   AST 23 15 - 41 U/L   ALT 6 0 - 44 U/L   Alkaline Phosphatase 96 38 - 126 U/L   Total Bilirubin 0.7 0.3 - 1.2 mg/dL   GFR, Estimated 31 (L) >60 mL/min   Anion gap 12 5 - 15  Magnesium  Result Value Ref Range   Magnesium 1.9 1.7 - 2.4 mg/dL  Comprehensive metabolic panel  Result Value Ref Range   Sodium 138 135 - 145 mmol/L   Potassium 3.7 3.5 - 5.1 mmol/L   Chloride 102 98 - 111 mmol/L   CO2 23 22 - 32 mmol/L   Glucose, Bld 111 (H) 70 - 99 mg/dL   BUN 28 (H) 8 - 23 mg/dL   Creatinine, Ser 1.33 (H) 0.44 - 1.00 mg/dL   Calcium 9.1 8.9 - 10.3 mg/dL   Total Protein 6.9 6.5 - 8.1 g/dL    Albumin 3.5 3.5 - 5.0 g/dL   AST 19 15 - 41 U/L   ALT 7 0 - 44 U/L   Alkaline Phosphatase 91 38 - 126 U/L   Total Bilirubin 0.8 0.3 - 1.2 mg/dL   GFR, Estimated 40 (L) >60 mL/min   Anion gap 13 5 - 15  CBC WITH DIFFERENTIAL  Result Value Ref Range   WBC 11.2 (H) 4.0 - 10.5 K/uL   RBC 4.24 3.87 - 5.11 MIL/uL   Hemoglobin 12.0 12.0 - 15.0 g/dL   HCT 38.6 36.0 - 46.0 %   MCV 91.0 80.0 - 100.0 fL   MCH 28.3 26.0 - 34.0 pg   MCHC 31.1 30.0 - 36.0 g/dL   RDW 14.0 11.5 - 15.5 %   Platelets 188 150 - 400 K/uL   nRBC 0.0 0.0 -  0.2 %   Neutrophils Relative % 81 %   Neutro Abs 9.1 (H) 1.7 - 7.7 K/uL   Lymphocytes Relative 10 %   Lymphs Abs 1.1 0.7 - 4.0 K/uL   Monocytes Relative 8 %   Monocytes Absolute 0.9 0.1 - 1.0 K/uL   Eosinophils Relative 0 %   Eosinophils Absolute 0.0 0.0 - 0.5 K/uL   Basophils Relative 0 %   Basophils Absolute 0.1 0.0 - 0.1 K/uL   Immature Granulocytes 1 %   Abs Immature Granulocytes 0.06 0.00 - 0.07 K/uL   C. Wynetta Emery, MD [facility]   05/16/2020  9:40 PM How to contact the [facility] Attending or Consulting provider [place] or covering provider during after hours [place], for this patient?  1. Check the care team in [facility] and look for a) attending/consulting [facility] provider listed and b) the [facility] team listed 2. Log into [email or web address] and use [place]'s universal password to access. If you do not have the password, please contact the hospital operator. 3. Locate the [facility] provider you are looking for under [facility] and page to a number that you can be directly reached. 4. If you still have difficulty reaching the provider, please page the [facility] (Director on Call) for the Hospitalists listed on amion for assistance.

## 2020-05-17 NOTE — Progress Notes (Signed)
EEG Completed; Results Pending  

## 2020-05-17 NOTE — H&P (Signed)
TRH H&P    Patient Demographics:    Jacqueline Flores, is a 85 y.o. female  MRN: [id number]  DOB - 29-Jul-1935  Admit Date - 05/16/2020  Referring MD/NP/PA: Sabra Heck  Outpatient Primary MD for the patient is Mayra Neer, MD  Patient coming from: Home  Chief complaint-fall   HPI:    Jacqueline Flores  is a 85 y.o. female, with history of Parkinson's disease, myocardial infarction, hypertension, hyperlipidemia, GERD, [name] presents to the ED with a chief complaint of fall.  Patient is confused, waxing and waning confusion has been normal for her recently, so daughter provides most of history.  Daughter reports that patient has been having vagal syncopal episodes.  The last one was at the beginning of March.  Today patient hit her life alert button and when daughter got over there she was diaphoretic, and altered from baseline.  Daughter describes her as groggy, slumped over, mumbling incoherently.  Blood pressure was 110/78, but when they tried to stand her up it dropped.  Daughter reports it is normal for her to have these episodes after syncope where she is altered from baseline.  About an hour after her syncopal episode she was closer to her baseline mentation so family try to stand her up.  She complained that her right leg.  She could not weight-bear.  Is normal for her to have cramps in her legs but she is usually able to bear weight.  The decision was made to get patient to the ER.  Of note patient has no history of seizures.  Patient herself does not remember much of the event.  She reports that she was walking out of the bathroom with a cup of tea- which is abnormal and she is unable to explain-without her walker when she lost her balance and fell.  She thinks she hit her head.  She is not sure if she lost consciousness.  She not aware of any preceding symptoms, but overall is a difficult historian due to her  confusion.  She was started on a new medication 3 weeks ago-Zoloft.  Her last syncopal episode was before the Zoloft was started.  Patient is a former smoker, does not drink alcohol, does not use illicit drugs.  She is vaccinated for COVID.  Patient is DNR.  In the ED Temp 97.8, heart rate 64, respiratory rate 18, blood pressure 151/82, satting at 94% Leukocytosis with white blood cell count 13.2, hemoglobin 13.0 Chemistry panel shows a creatinine of 1.64 CT head shows advanced senescent changes similar to that seen on prior exam with no acute abnormality or fracture EKG shows a heart rate of 68, sinus rhythm, QTC 426 Bilateral superior and inferior pubic rami fractures with mild displacement on right as well as lower lumbar spondylosis shown on x-ray Right femur is unremarkable with right hip and knee well aligned Patient was adamant that she wanted to go home, but was not able to ambulate in the ER so admission requested for pain control and PT evaluation    Review of systems:  Fortunately review systems could not be obtained due to patient's confusion/altered mental status   Past History of the following :    Past Medical History:  Diagnosis Date  . Coronary artery disease    post MI  . Esophageal reflux   . Hematuria   . Hyperlipidemia   . Hypertension   . MI (myocardial infarction) ([place])    age 72  . Osteoarthritis   . Osteopenia       Past Surgical History:  Procedure Laterality Date  . CATARACT EXTRACTION W/ INTRAOCULAR LENS  IMPLANT, BILATERAL    . DILATION AND CURETTAGE OF UTERUS    . lid lift     bilaterally, lid lag  . NEPHROLITHOTOMY        Social History:      Social History   Tobacco Use  . Smoking status: Former Smoker    Years: 30.00    Quit date: 10/28/2010    Years since quitting: 9.5  . Smokeless tobacco: Never Used  Substance Use Topics  . Alcohol use: Yes    Comment: glass of wine once a month       Family History :     Family  History  Problem Relation Age of Onset  . Breast cancer Mother   . Heart attack Father   . CAD Father   . Diabetes Maternal Grandmother       Home Medications:   Prior to Admission medications   Medication Sig Start Date End Date Taking? Authorizing Provider  aspirin 81 MG tablet Take 81 mg by mouth daily.   Yes [provider]  Biotin 1 MG CAPS Take 1 capsule by mouth daily.    Yes [provider]  calcium carbonate (OSCAL) 1500 (600 Ca) MG TABS tablet Take 1,500 mg by mouth daily with breakfast.    Yes [provider]  calcium-vitamin D (OSCAL WITH D) 500-200 MG-UNIT tablet Take 1 tablet by mouth.   Yes [provider]  carbidopa-levodopa (SINEMET CR) 50-200 MG tablet TAKE ONE TABLET DAILY AT BEDTIME 03/28/20  Yes Tat, Rebecca S, DO  carbidopa-levodopa (SINEMET IR) 25-100 MG tablet TAKE 1 AND 1/2 TABLET 3 TIMES A DAY 04/24/20  Yes Tat, Rebecca S, DO  Garlic 10 MG CAPS Take by mouth daily.    Yes [provider]  Omega-3 Fatty Acids (FISH OIL) 1000 MG CAPS Take 1 capsule by mouth 3 (three) times daily.   Yes [provider]  omeprazole (PRILOSEC) 40 MG capsule Take 40 mg by mouth daily.   Yes [provider]  pravastatin (PRAVACHOL) 80 MG tablet Take 80 mg by mouth daily.   Yes [provider]  sertraline (ZOLOFT) 50 MG tablet Take 50 mg by mouth at bedtime.   Yes [provider]  TH VITAMIN E PO Take 670 mg by mouth.   Yes [provider]  vitamin C (ASCORBIC ACID) 500 MG tablet Take 500 mg by mouth daily.   Yes [provider]  Zinc 50 MG CAPS Take by mouth.   Yes [provider]  Cranberry 1000 MG CAPS Take by mouth daily.     [provider]  NIFEdipine (PROCARDIA-XL/ADALAT CC) 30 MG 24 hr tablet Take 15 mg by mouth daily.     [provider]     Allergies:     Allergies  Allergen Reactions  . Zetia [Ezetimibe]     Swelling   . Sulfa Antibiotics Rash  Physical Exam:   Vitals  Blood pressure (!) 154/84, pulse 71, temperature 98 F (36.7 C), temperature source Oral, resp. rate 20, height 5\' 1"  (1.549 m), weight 62 kg, SpO2 92 %.  1.  General: Patient lying supine in bed in no acute distress  2. Psychiatric: Flat affect, behavior normal for situation, cooperative with exam, alert, oriented to person place time president, and a week, but still confused about what happened at home  3. Neurologic: Cranial nerves II through XII are intact, moves all 4 extremities voluntarily, equal sensation in the upper and lower extremities bilaterally, speech and language normal, alert and oriented x3  4. HEENMT:  Head is atraumatic, normocephalic, pupils are reactive to light, neck is supple, trachea is midline, mucous membranes are moist  5. Respiratory : Lungs are clear to auscultation without wheezes, rhonchi, rales, no cyanosis, no clubbing  6. Cardiovascular : Heart rate is normal, loud systolic murmur, no rubs or gallops, no peripheral edema, peripheral pulses palpated  7. Gastrointestinal:  Abdomen is soft, nondistended, nontender to palpation, bowel sounds active  8. Skin:  Skin is warm dry and intact without acute lesions on limited exam  9.Musculoskeletal:  No calf tenderness, muscle spasms palpable in the right lower extremity, right femur tender to palpation, no acute deformities, no calf tenderness    Data Review:    CBC Recent Labs  Lab 05/16/20 2200  WBC 13.2*  HGB 13.0  HCT 41.1  PLT 225  MCV 90.9  MCH 28.8  MCHC 31.6  RDW 14.2   ------------------------------------------------------------------------------------------------------------------  Results for orders placed or performed during the hospital encounter of 05/16/20 (from the past 48 hour(s))  CBC     Status: Abnormal   Collection Time: 05/16/20 10:00 PM  Result Value Ref Range   WBC 13.2 (H) 4.0 - 10.5 K/uL   RBC 4.52 3.87 - 5.11 MIL/uL    Hemoglobin 13.0 12.0 - 15.0 g/dL   HCT 41.1 36.0 - 46.0 %   MCV 90.9 80.0 - 100.0 fL   MCH 28.8 26.0 - 34.0 pg   MCHC 31.6 30.0 - 36.0 g/dL   RDW 14.2 11.5 - 15.5 %   Platelets 225 150 - 400 K/uL   nRBC 0.0 0.0 - 0.2 %    Comment: Performed at [facility], [address], [place], [place] [address]  Comprehensive metabolic panel     Status: Abnormal   Collection Time: 05/16/20 10:00 PM  Result Value Ref Range   Sodium 136 135 - 145 mmol/L   Potassium 3.4 (L) 3.5 - 5.1 mmol/L   Chloride 99 98 - 111 mmol/L   CO2 25 22 - 32 mmol/L   Glucose, Bld 126 (H) 70 - 99 mg/dL    Comment: Glucose reference range applies only to samples taken after fasting for at least 8 hours.   BUN 26 (H) 8 - 23 mg/dL   Creatinine, Ser 1.64 (H) 0.44 - 1.00 mg/dL   Calcium 9.1 8.9 - 10.3 mg/dL   Total Protein 7.9 6.5 - 8.1 g/dL   Albumin 3.9 3.5 - 5.0 g/dL   AST 23 15 - 41 U/L   ALT 6 0 - 44 U/L   Alkaline Phosphatase 96 38 - 126 U/L   Total Bilirubin 0.7 0.3 - 1.2 mg/dL   GFR, Estimated 31 (L) >60 mL/min    Comment: (NOTE) Calculated using the CKD-EPI Creatinine Equation (2021)    Anion gap 12 5 - 15    Comment: Performed at [employer] [facility], [address], [place], [place] [address]    Chemistries  Recent Labs  Lab 05/16/20 2200  NA 136  K 3.4*  CL 99  CO2 25  GLUCOSE 126*  BUN 26*  CREATININE 1.64*  CALCIUM 9.1  AST 23  ALT 6  ALKPHOS 96  BILITOT 0.7   ------------------------------------------------------------------------------------------------------------------  ------------------------------------------------------------------------------------------------------------------ GFR: Estimated Creatinine Clearance: 22 mL/min (A) (by C-G formula based on SCr of 1.64 mg/dL (H)). Liver Function Tests: Recent Labs  Lab 05/16/20 2200  AST 23  ALT 6  ALKPHOS 96  BILITOT 0.7  PROT 7.9  ALBUMIN 3.9   No results for input(s): LIPASE, AMYLASE in the last 168 hours. No results for  input(s): AMMONIA in the last 168 hours. Coagulation Profile: No results for input(s): INR, PROTIME in the last 168 hours. Cardiac Enzymes: No results for input(s): CKTOTAL, CKMB, CKMBINDEX, TROPONINI in the last 168 hours. BNP (last 3 results) No results for input(s): PROBNP in the last 8760 hours. HbA1C: No results for input(s): HGBA1C in the last 72 hours. CBG: No results for input(s): GLUCAP in the last 168 hours. Lipid Profile: No results for input(s): CHOL, HDL, LDLCALC, TRIG, CHOLHDL, LDLDIRECT in the last 72 hours. Thyroid Function Tests: No results for input(s): TSH, T4TOTAL, FREET4, T3FREE, THYROIDAB in the last 72 hours. Anemia Panel: No results for input(s): VITAMINB12, FOLATE, FERRITIN, TIBC, IRON, RETICCTPCT in the last 72 hours.  --------------------------------------------------------------------------------------------------------------- Urine analysis: No results found for: COLORURINE, APPEARANCEUR, LABSPEC, PHURINE, GLUCOSEU, HGBUR, BILIRUBINUR, KETONESUR, PROTEINUR, UROBILINOGEN, NITRITE, LEUKOCYTESUR    Imaging Results:    CT Head Wo Contrast  Result Date: 05/16/2020 CLINICAL DATA:  Fall, head injury, loss of consciousness EXAM: CT HEAD WITHOUT CONTRAST TECHNIQUE: Contiguous axial images were obtained from the base of the skull through the vertex without intravenous contrast. COMPARISON:  MRI 11/09/2013 FINDINGS: Brain: Normal anatomic configuration. Relatively advanced diffuse parenchymal volume loss is again noted. Extensive subcortical and periventricular white matter changes are present likely reflecting the sequela of small vessel ischemia. No abnormal intra or extra-axial mass lesion or fluid collection. No abnormal mass effect or midline shift. No evidence of acute intracranial hemorrhage or infarct. Borderline ventriculomegaly likely reflects ex vacuo dilation secondary to central atrophy. Cerebellum unremarkable. Vascular: No asymmetric hyperdense vasculature  at the skull base. Skull: Intact Sinuses/Orbits: Paranasal sinuses are clear. Orbits are unremarkable. Other: Mastoid air cells and middle ear cavities are clear. IMPRESSION: No acute intracranial abnormality.  No calvarial fracture. Advanced senescent change, similar to that seen on prior examination. Electronically Signed   By: Fidela Salisbury MD   On: 05/16/2020 22:44   DG Femur Min 2 Views Right  Result Date: 05/16/2020 CLINICAL DATA:  Weakness, fell, right hip pain EXAM: RIGHT FEMUR 2 VIEWS; DG HIP (WITH OR WITHOUT PELVIS) 3-4V BILAT COMPARISON:  None. FINDINGS: Bilateral hips: Frontal view of the pelvis as well as frontal and frogleg lateral views of the bilateral hips are obtained. There are comminuted minimally displaced fractures through the bilateral superior and inferior pubic rami, with mild displacement on the right. No other acute bony abnormalities. Hips are well aligned. Mild symmetrical joint space narrowing. Prominent lower lumbar spondylosis. Right femur: Frontal and lateral views demonstrate an unremarkable right femur. Right hip and knee are well aligned. Soft tissues are unremarkable. IMPRESSION: 1. Bilateral superior and inferior pubic rami fractures, with mild displacement on the right. 2. No other acute bony abnormalities. 3. Lower lumbar spondylosis. 4. Mild symmetrical hip osteoarthritis. Electronically Signed   By:  Randa Ngo M.D.   On: 05/16/2020 22:34   DG Hips Bilat W or Wo Pelvis 3-4 Views  Result Date: 05/16/2020 CLINICAL DATA:  Weakness, fell, right hip pain EXAM: RIGHT FEMUR 2 VIEWS; DG HIP (WITH OR WITHOUT PELVIS) 3-4V BILAT COMPARISON:  None. FINDINGS: Bilateral hips: Frontal view of the pelvis as well as frontal and frogleg lateral views of the bilateral hips are obtained. There are comminuted minimally displaced fractures through the bilateral superior and inferior pubic rami, with mild displacement on the right. No other acute bony abnormalities. Hips are well  aligned. Mild symmetrical joint space narrowing. Prominent lower lumbar spondylosis. Right femur: Frontal and lateral views demonstrate an unremarkable right femur. Right hip and knee are well aligned. Soft tissues are unremarkable. IMPRESSION: 1. Bilateral superior and inferior pubic rami fractures, with mild displacement on the right. 2. No other acute bony abnormalities. 3. Lower lumbar spondylosis. 4. Mild symmetrical hip osteoarthritis. Electronically Signed   By: Randa Ngo M.D.   On: 05/16/2020 22:34     Assessment & Plan:    Active Problems:   Bilateral fracture of pubic rami (HCC)   1. Syncope and collapse 1. Monitor on telemetry, echo in the morning, ultrasound carotids 2. Orthostatics every shift 3. More likely orthostatic if patient had just got up and started walking out of the bathroom, possibly vagal if patient had just use the bathroom, both altered mental status for an hour afterwards may consider EEG as well 4. Continue to monitor 2. Bilateral pubic rami fracture 1. Nonsurgical 2. PT eval and treat 3. Transition of care in case patient needs acute rehab 3. Hypokalemia 1. Replace and recheck 2. Check magnesium in a.m. 4. AKI 1. Last creatinine is from 2015 so she may have had a steady trend up, but last creatinine was 0.77 and today is 1.64 2. Avoid nephrotoxic agents when possible 3. Gentle hydration 4. Trend in the a.m. 5. Leukocytosis 1. Most likely acute phase reactant given recent fractures 2. No infectious symptoms reported 3. Trend in the a.m. 6. Depression 1. Continue Zoloft 7.    DVT Prophylaxis-   Heparin - SCDs   AM Labs Ordered, also please review Full Orders  Family Communication: Admission, patients condition and plan of care including tests being ordered have been discussed with the patient and daughter who indicate understanding and agree with the plan and Code Status.  Code Status: DNR  Admission status: Observation  Time spent in  minutes : [place]

## 2020-05-17 NOTE — TOC Initial Note (Signed)
Transition of Care [facility]) - Initial/Assessment Note    Patient Details  Name: Jacqueline Flores MRN: [id number] Date of Birth: 1935/06/07  Transition of [facility]) CM/SW Contact:    Shade Flood, LCSW Phone Number: 05/17/2020, 11:32 AM  Clinical Narrative:                  Pt admitted from home. PT recommending SNF rehab. Spoke with pt's daughter who states that pt and family are agreeable to SNF referrals. Updated daughter on CMS provider options and will refer as requested. Once facility is chosen, the SNF will need to start insurance authorization.  Anticipate dc to SNF once authorization process complete.  TOC will follow.  Expected Discharge Plan: Skilled Nursing Facility Barriers to Discharge: Insurance Authorization   Patient Goals and CMS Choice Patient states their goals for this hospitalization and ongoing recovery are:: get better CMS Medicare.gov Compare Post Acute Care list provided to:: Patient Represenative (must comment) Choice offered to / list presented to : Adult Children  Expected Discharge Plan and Services Expected Discharge Plan: [place] In-house Referral: Clinical Social Work   Post Acute Care Choice: [place] Living arrangements for the past 2 months: [place]                                      Prior Living Arrangements/Services Living arrangements for the past 2 months: Single Family Home   Patient language and need for interpreter reviewed:: Yes Do you feel safe going back to the place where you live?: Yes      Need for Family Participation in Patient Care: Yes (Comment) Care giver support system in place?: Yes (comment) Current home services: DME Criminal Activity/Legal Involvement Pertinent to Current Situation/Hospitalization: No - Comment as needed  Activities of Daily Living Home Assistive Devices/Equipment: None ADL Screening (condition at time of admission) Patient's cognitive  ability adequate to safely complete daily activities?: Yes Is the patient deaf or have difficulty hearing?: No Does the patient have difficulty seeing, even when wearing glasses/contacts?: No Does the patient have difficulty concentrating, remembering, or making decisions?: No Patient able to express need for assistance with ADLs?: Yes Does the patient have difficulty dressing or bathing?: Yes Independently performs ADLs?: No Communication: Independent Dressing (OT): Needs assistance Is this a change from baseline?: Pre-admission baseline Grooming: Needs assistance Is this a change from baseline?: Pre-admission baseline Feeding: Independent Bathing: Needs assistance Is this a change from baseline?: Pre-admission baseline Toileting: Needs assistance Is this a change from baseline?: Pre-admission baseline In/Out Bed: Needs assistance Is this a change from baseline?: Pre-admission baseline Walks in Home: Needs assistance Is this a change from baseline?: Pre-admission baseline Does the patient have difficulty walking or climbing stairs?: Yes Weakness of Legs: Both Weakness of Arms/Hands: None  Permission Sought/Granted Permission sought to share information with : Chartered certified accountant granted to share information with : Yes, Verbal Permission Granted     Permission granted to share info w AGENCY: [facility]        Emotional Assessment       Orientation: : Oriented to Self,Oriented to Place,Oriented to  Time,Oriented to Situation Alcohol / Substance Use: Not Applicable Psych Involvement: No (comment)  Admission diagnosis:  Bilateral fracture of pubic rami ([place]) [S32.591A, S32.592A] Patient Active Problem List   Diagnosis Date Noted  . Bilateral fracture of pubic rami ([place]) 05/16/2020  .  HLD (hyperlipidemia) 05/12/2019  . Hypertension 05/12/2019  . GERD (gastroesophageal reflux disease) 05/12/2019  . Ocular hypertension, right 04/20/2019  . Bilateral dry  eyes 11/23/2013  . Pure hypercholesterolemia 03/10/2013  . Coronary atherosclerosis of unspecified type of vessel, native or graft 03/10/2013  . Esophageal reflux 03/10/2013  . Benign hypertensive heart disease without heart failure 03/10/2013  . Old myocardial infarction 03/10/2013  . Disorder of bone and cartilage, unspecified 03/10/2013  . 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 Pseudophakia, both eyes 12/01/2011  . Hordeolum 08/29/2011  . Dermatochalasis 11/29/2010   PCP:  Mayra Neer, MD Pharmacy:   [facility] [place] [address] Phone: [phone] Fax: [phone]     Social Determinants of Health (SDOH) Interventions    Readmission Risk Interventions No flowsheet data found.

## 2020-05-17 NOTE — NC FL2 (Signed)
[place] LEVEL OF CARE SCREENING TOOL     IDENTIFICATION  Patient Name: Jacqueline Flores Birthdate: [birth year] Sex: female Admission Date (Current Location): 05/16/2020  [facility] and [place] Number:  [employer] and Address:  [place] [address] [place]      Provider Number: [id number]  Attending Physician Name and Address:  Murlean Iba, MD  Relative Name and Phone Number:       Current Level of Care: Hospital Recommended Level of Care: [place] Prior Approval Number:    Date Approved/Denied:   PASRR Number: [id number]  Discharge Plan: SNF    Current Diagnoses: Patient Active Problem List   Diagnosis Date Noted  . Bilateral fracture of pubic rami ([place]) 05/16/2020  . HLD (hyperlipidemia) 05/12/2019  . Hypertension 05/12/2019  . GERD (gastroesophageal reflux disease) 05/12/2019  . Ocular hypertension, right 04/20/2019  . Bilateral dry eyes 11/23/2013  . Pure hypercholesterolemia 03/10/2013  . Coronary atherosclerosis of unspecified type of vessel, native or graft 03/10/2013  . Esophageal reflux 03/10/2013  . Benign hypertensive heart disease without heart failure 03/10/2013  . Old myocardial infarction 03/10/2013  . Disorder of bone and cartilage, unspecified 03/10/2013  . 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 Pseudophakia, both eyes 12/01/2011  . Hordeolum 08/29/2011  . Dermatochalasis 11/29/2010    Orientation RESPIRATION BLADDER Height & Weight     Self,Time,Situation,Place  Normal Continent Weight: 135 lb 2.3 oz (61.3 kg) Height:  5\' 3"  (160 cm)  BEHAVIORAL SYMPTOMS/MOOD NEUROLOGICAL BOWEL NUTRITION STATUS      Continent Diet (see dc summary)  AMBULATORY STATUS COMMUNICATION OF NEEDS Skin   Extensive Assist Verbally Normal                       Personal Care Assistance Level of Assistance  Bathing,Feeding,Dressing Bathing Assistance: Limited assistance Feeding assistance:  Independent Dressing Assistance: Limited assistance     Functional Limitations Info  Sight,Hearing,Speech Sight Info: Adequate Hearing Info: Adequate Speech Info: Adequate    SPECIAL CARE FACTORS FREQUENCY  PT (By licensed PT),OT (By licensed OT)     PT Frequency: 5x week OT Frequency: 3x week            Contractures Contractures Info: Not present    Additional Factors Info  Code Status,Allergies Code Status Info: DNR Allergies Info: Zetia, Sulfa Antibiotics           Current Medications (05/17/2020):  This is the current hospital active medication list Current Facility-Administered Medications  Medication Dose Route Frequency Provider Last Rate Last Admin  . 0.9 %  sodium chloride infusion   Intravenous Continuous Wynetta Emery, Clanford L, MD 50 mL/hr at 05/17/20 0953 New Bag at 05/17/20 0953  . acetaminophen (TYLENOL) tablet 650 mg  650 mg Oral Q6H PRN Zierle-Ghosh, Asia B, DO       Or  . acetaminophen (TYLENOL) suppository 650 mg  650 mg Rectal Q6H PRN Zierle-Ghosh, Asia B, DO      . aspirin EC tablet 81 mg  81 mg Oral Daily Zierle-Ghosh, Asia B, DO   81 mg at 05/17/20 1056  . carbidopa-levodopa (SINEMET CR) 50-200 MG per tablet controlled release 1 tablet  1 tablet Oral QHS Zierle-Ghosh, Asia B, DO      . carbidopa-levodopa (SINEMET IR) 25-100 MG per tablet immediate release 1.5 tablet  1.5 tablet Oral TID Zierle-Ghosh, Asia B, DO   1.5 tablet at 05/17/20 1123  . cyclobenzaprine (FLEXERIL) tablet 5  mg  5 mg Oral TID PRN Zierle-Ghosh, Asia B, DO      . fentaNYL (SUBLIMAZE) injection 12.5-50 mcg  12.5-50 mcg Intravenous Q2H PRN Zierle-Ghosh, Asia B, DO   12.5 mcg at 05/17/20 0947  . heparin injection 5,000 Units  5,000 Units Subcutaneous Q8H Zierle-Ghosh, Asia B, DO   5,000 Units at 05/17/20 0506  . ondansetron (ZOFRAN) tablet 4 mg  4 mg Oral Q6H PRN Zierle-Ghosh, Asia B, DO       Or  . ondansetron (ZOFRAN) injection 4 mg  4 mg Intravenous Q6H PRN Zierle-Ghosh, Asia B, DO       . oxyCODONE (Oxy IR/ROXICODONE) immediate release tablet 5 mg  5 mg Oral Q4H PRN Zierle-Ghosh, Asia B, DO      . pravastatin (PRAVACHOL) tablet 80 mg  80 mg Oral QPM Zierle-Ghosh, Asia B, DO      . sertraline (ZOLOFT) tablet 50 mg  50 mg Oral QHS Zierle-Ghosh, Asia B, DO         Discharge Medications: Please see discharge summary for a list of discharge medications.  Relevant Imaging Results:  Relevant Lab Results:   Additional Information SSN: [id number] [place]

## 2020-05-18 ENCOUNTER — Encounter (HOSPITAL_COMMUNITY): Payer: Self-pay | Admitting: Family Medicine

## 2020-05-18 ENCOUNTER — Inpatient Hospital Stay (HOSPITAL_COMMUNITY): Payer: Medicare HMO

## 2020-05-18 DIAGNOSIS — N179 Acute kidney failure, unspecified: Secondary | ICD-10-CM | POA: Diagnosis not present

## 2020-05-18 DIAGNOSIS — I361 Nonrheumatic tricuspid (valve) insufficiency: Secondary | ICD-10-CM | POA: Diagnosis not present

## 2020-05-18 DIAGNOSIS — Z9181 History of falling: Secondary | ICD-10-CM

## 2020-05-18 DIAGNOSIS — I119 Hypertensive heart disease without heart failure: Secondary | ICD-10-CM

## 2020-05-18 DIAGNOSIS — G8929 Other chronic pain: Secondary | ICD-10-CM | POA: Diagnosis present

## 2020-05-18 DIAGNOSIS — I951 Orthostatic hypotension: Secondary | ICD-10-CM

## 2020-05-18 DIAGNOSIS — K219 Gastro-esophageal reflux disease without esophagitis: Secondary | ICD-10-CM

## 2020-05-18 DIAGNOSIS — S32591D Other specified fracture of right pubis, subsequent encounter for fracture with routine healing: Secondary | ICD-10-CM | POA: Diagnosis not present

## 2020-05-18 DIAGNOSIS — R55 Syncope and collapse: Secondary | ICD-10-CM | POA: Diagnosis not present

## 2020-05-18 LAB — ECHOCARDIOGRAM COMPLETE
Area-P 1/2: 2.83 cm2
Height: 63 in
S' Lateral: 1.93 cm
Weight: 2303.37 oz

## 2020-05-18 LAB — CBC
HCT: 33.6 % — ABNORMAL LOW (ref 36.0–46.0)
Hemoglobin: 10.7 g/dL — ABNORMAL LOW (ref 12.0–15.0)
MCH: 28.7 pg (ref 26.0–34.0)
MCHC: 31.8 g/dL (ref 30.0–36.0)
MCV: 90.1 fL (ref 80.0–100.0)
Platelets: 166 10*3/uL (ref 150–400)
RBC: 3.73 MIL/uL — ABNORMAL LOW (ref 3.87–5.11)
RDW: 14.4 % (ref 11.5–15.5)
WBC: 8.5 10*3/uL (ref 4.0–10.5)
nRBC: 0 % (ref 0.0–0.2)

## 2020-05-18 LAB — BASIC METABOLIC PANEL
Anion gap: 10 (ref 5–15)
BUN: 23 mg/dL (ref 8–23)
CO2: 23 mmol/L (ref 22–32)
Calcium: 8.4 mg/dL — ABNORMAL LOW (ref 8.9–10.3)
Chloride: 104 mmol/L (ref 98–111)
Creatinine, Ser: 0.97 mg/dL (ref 0.44–1.00)
GFR, Estimated: 58 mL/min — ABNORMAL LOW (ref >60)
Glucose, Bld: 90 mg/dL (ref 70–99)
Potassium: 3.2 mmol/L — ABNORMAL LOW (ref 3.5–5.1)
Sodium: 137 mmol/L (ref 135–145)

## 2020-05-18 MED ORDER — SENNOSIDES-DOCUSATE SODIUM 8.6-50 MG PO TABS
2.0000 | ORAL_TABLET | Freq: Every day | ORAL | Status: DC
  Administered 2020-05-18 – 2020-05-20 (×3): 2 via ORAL
  Filled 2020-05-18 (×3): qty 2

## 2020-05-18 MED ORDER — OXYCODONE HCL 5 MG PO TABS: 5.0000 mg | ORAL_TABLET | Freq: Four times a day (QID) | ORAL | Status: DC

## 2020-05-18 MED ORDER — POTASSIUM CHLORIDE CRYS ER 20 MEQ PO TBCR
40.0000 meq | EXTENDED_RELEASE_TABLET | Freq: Once | ORAL | Status: AC
  Administered 2020-05-18: 40 meq via ORAL
  Filled 2020-05-18: qty 2

## 2020-05-18 MED ORDER — PANTOPRAZOLE SODIUM 40 MG PO TBEC
40.0000 mg | DELAYED_RELEASE_TABLET | Freq: Every day | ORAL | Status: DC
  Administered 2020-05-19 – 2020-05-21 (×3): 40 mg via ORAL
  Filled 2020-05-18 (×3): qty 1

## 2020-05-18 MED ORDER — OXYCODONE HCL 5 MG PO TABS
5.0000 mg | ORAL_TABLET | Freq: Four times a day (QID) | ORAL | Status: DC | PRN
  Administered 2020-05-19: 5 mg via ORAL
  Filled 2020-05-18: qty 1

## 2020-05-18 NOTE — Progress Notes (Signed)
PROGRESS NOTE   [name]  JEH:[id number] DOB: 1935-07-23 DOA: 05/16/2020 PCP: Mayra Neer, MD   Chief Complaint  Patient presents with  . Loss of Consciousness   Level of care: Med-Surg  Brief Admission History:  85 y.o. female, with history of Parkinson's disease, myocardial infarction, hypertension, hyperlipidemia, GERD, Laurance Flores presents to the ED with a chief complaint of fall.  Patient is confused, waxing and waning confusion has been normal for her recently, so daughter provides most of history.  Daughter reports that patient has been having vagal syncopal episodes.  The last one was at the beginning of March.  Today patient hit her life alert button and when daughter got over there she was diaphoretic, and altered from baseline.  Daughter describes her as groggy, slumped over, mumbling incoherently.  Blood pressure was 110/78, but when they tried to stand her up it dropped.  Daughter reports it is normal for her to have these episodes after syncope where she is altered from baseline.  About an hour after her syncopal episode she was closer to her baseline mentation so family try to stand her up.  She complained that her right leg.  She could not weight-bear.  Is normal for her to have cramps in her legs but she is usually able to bear weight.  The decision was made to get patient to the ER.  Of note patient has no history of seizures.  Patient herself does not remember much of the event.  She reports that she was walking out of the bathroom with a cup of tea- which is abnormal and she is unable to explain-without her walker when she lost her balance and fell.  She thinks she hit her head.  She is not sure if she lost consciousness.  She not aware of any preceding symptoms, but overall is a difficult historian due to her confusion.  She was started on a new medication 3 weeks ago-Zoloft.  Her last syncopal episode was before the Zoloft was started.  Patient is a former smoker, does not  drink alcohol, does not use illicit drugs.  She is vaccinated for COVID.  Patient is DNR.  In the ED Temp 97.8, heart rate 64, respiratory rate 18, blood pressure 151/82, satting at 94% Leukocytosis with white blood cell count 13.2, hemoglobin 13.0 Chemistry panel shows a creatinine of 1.64 CT head shows advanced senescent changes similar to that seen on prior exam with no acute abnormality or fracture  EKG shows a heart rate of 68, sinus rhythm, QTC 426 Bilateral superior and inferior pubic rami fractures with mild displacement on right as well as lower lumbar spondylosis shown on x-ray Right femur is unremarkable with right hip and knee well aligned Patient was adamant that she wanted to go home, but was not able to ambulate in the ER so admission requested for pain control and PT evaluation  Assessment & Plan:   Principal Problem:   Bilateral fracture of pubic rami (HCC) Active Problems:   Coronary atherosclerosis   Benign hypertensive heart disease without heart failure   Bilateral dry eyes   HLD (hyperlipidemia)   GERD (gastroesophageal reflux disease)   Syncope and collapse   Chronic pain   At high risk for falls   Orthostatic hypotension   Acute kidney injury ([place])   Bilateral pubic rami fracture - discussed with orthopedics, conservative management, pain control and physical therapy recommended. Goal at this time is pain control and ambulation.  PT eval requested and they  are recommending SNF placement.   Orthostatic hypotension and recurrent syncope - Pt was clinically dehydrated and has responded well to gentle IV fluid hydration. Fall precautions recommended.  Pt will need SNF level care after discharge from hospital.  Carotid dopplers - no hemodynamically significant stenosis. Follow up 2D echocardiogram (still pending at this time).   Hypokalemia - this has been repleted orally.  Hypomagnesemia - This has been replaced.   AKI - prerenal - resolved after IV fluids.    Leukocytosis - reactive from fall and acute fracture - this has resolved.   Depression - resumed home sertraline.    GERD - ordered protonix for GI protection.   Chronic pain - remains uncontrolled. Schedule tylenol and oxycodone today and see if we can get better control over pain.  Added peri-colace daily for opioid induced constipation.    DVT prophylaxis: SQ heparin Code Status: full  Family Communication: sister telephone  Disposition: anticipating SNF placement  Status is: Inpatient  Remains inpatient appropriate because:Unsafe d/c plan, IV treatments appropriate due to intensity of illness or inability to take PO and Inpatient level of care appropriate due to severity of illness   Dispo: The patient is from: Home              Anticipated d/c is to: SNF              Patient currently is not medically stable to d/c.   Difficult to place patient No   Consultants:     Procedures:     Antimicrobials:     Subjective: Pt reports that pain persists although she says she is eating a little better.  She hadn't been asking for pain meds.   Objective: Vitals:   05/17/20 2036 05/18/20 0614 05/18/20 0700 05/18/20 1252  BP: (!) 159/93 (!) 165/88  (!) 160/92  Pulse: 70 70  70  Resp: (!) 22 (!) 24  17  Temp: 98.9 F (37.2 C) 99.1 F (37.3 C)  97.6 F (36.4 C)  TempSrc: Oral Oral  Oral  SpO2: 94% 96%  93%  Weight:   65.3 kg   Height:        Intake/Output Summary (Last 24 hours) at 05/18/2020 1545 Last data filed at 05/18/2020 1500 Gross per 24 hour  Intake 960 ml  Output 2500 ml  Net -1540 ml   Filed Weights   05/16/20 2141 05/17/20 0213 05/18/20 0700  Weight: 62 kg 61.3 kg 65.3 kg    Examination:  General exam: frail, elderly female lying supine in bed, alert, oriented to person, place, Appears calm and mildly uncomfortable.  Respiratory system: Clear to auscultation. Respiratory effort normal. Cardiovascular system: normal S1 & S2 heard. No JVD, murmurs,  rubs, gallops or clicks. No pedal edema. Gastrointestinal system: Abdomen is nondistended, soft and nontender. No organomegaly or masses felt. Normal bowel sounds heard. Central nervous system: Alert and oriented. No focal neurological deficits. Extremities: bilateral hip pain, diminished mobility due to pain. Skin: No rashes, lesions or ulcers Psychiatry: Judgement and insight appear poor. Mood & affect appropriate.   Data Reviewed: I have personally reviewed following labs and imaging studies  CBC: Recent Labs  Lab 05/16/20 2200 05/17/20 0437 05/18/20 0441  WBC 13.2* 11.2* 8.5  NEUTROABS  --  9.1*  --   HGB 13.0 12.0 10.7*  HCT 41.1 38.6 33.6*  MCV 90.9 91.0 90.1  PLT 225 188 557    Basic Metabolic Panel: Recent Labs  Lab 05/16/20 2200 05/17/20 0437 05/18/20  0441  NA 136 138 137  K 3.4* 3.7 3.2*  CL 99 102 104  CO2 25 23 23   GLUCOSE 126* 111* 90  BUN 26* 28* 23  CREATININE 1.64* 1.33* 0.97  CALCIUM 9.1 9.1 8.4*  MG  --  1.9  --     GFR: Estimated Creatinine Clearance: 40 mL/min (by C-G formula based on SCr of 0.97 mg/dL).  Liver Function Tests: Recent Labs  Lab 05/16/20 2200 05/17/20 0437  AST 23 19  ALT 6 7  ALKPHOS 96 91  BILITOT 0.7 0.8  PROT 7.9 6.9  ALBUMIN 3.9 3.5    CBG: No results for input(s): GLUCAP in the last 168 hours.  Recent Results (from the past 240 hour(s))  SARS CORONAVIRUS 2 (TAT 6-24 HRS) Nasopharyngeal Nasopharyngeal Swab     Status: None   Collection Time: 05/16/20 11:02 PM   Specimen: Nasopharyngeal Swab  Result Value Ref Range Status   SARS Coronavirus 2 NEGATIVE NEGATIVE Final    Comment: (NOTE) SARS-CoV-2 target nucleic acids are NOT DETECTED.  The SARS-CoV-2 RNA is generally detectable in upper and lower respiratory specimens during the acute phase of infection. Negative results do not preclude SARS-CoV-2 infection, do not rule out co-infections with other pathogens, and should not be used as the sole basis for  treatment or other patient management decisions. Negative results must be combined with clinical observations, patient history, and epidemiological information. The expected result is Negative.  Fact Sheet for Patients: SugarRoll.be  Fact Sheet for Healthcare Providers: https://www.woods-mathews.com/  This test is not yet approved or cleared by the [place] FDA and  has been authorized for detection and/or diagnosis of SARS-CoV-2 by FDA under an Emergency Use Authorization (EUA). This EUA will remain  in effect (meaning this test can be used) for the duration of the COVID-19 declaration under Se ction 564(b)(1) of the Act, 21 U.S.C. section 360bbb-3(b)(1), unless the authorization is terminated or revoked sooner.  Performed at [facility], [place] [address] [place], [place] [address]      Radiology Studies: CT Head Wo Contrast  Result Date: 05/16/2020 CLINICAL DATA:  Fall, head injury, loss of consciousness EXAM: CT HEAD WITHOUT CONTRAST TECHNIQUE: Contiguous axial images were obtained from the base of the skull through the vertex without intravenous contrast. COMPARISON:  MRI 11/09/2013 FINDINGS: Brain: Normal anatomic configuration. Relatively advanced diffuse parenchymal volume loss is again noted. Extensive subcortical and periventricular white matter changes are present likely reflecting the sequela of small vessel ischemia. No abnormal intra or extra-axial mass lesion or fluid collection. No abnormal mass effect or midline shift. No evidence of acute intracranial hemorrhage or infarct. Borderline ventriculomegaly likely reflects ex vacuo dilation secondary to central atrophy. Cerebellum unremarkable. Vascular: No asymmetric hyperdense vasculature at the skull base. Skull: Intact Sinuses/Orbits: Paranasal sinuses are clear. Orbits are unremarkable. Other: Mastoid air cells and middle ear cavities are clear. IMPRESSION: No acute  intracranial abnormality.  No calvarial fracture. Advanced senescent change, similar to that seen on prior examination. Electronically Signed   By: Fidela Salisbury MD   On: 05/16/2020 22:44   US Carotid Bilateral  Result Date: 05/17/2020 CLINICAL DATA:  Syncope with collapse, hyperlipidemia EXAM: BILATERAL CAROTID DUPLEX ULTRASOUND TECHNIQUE: Pearline Cables scale imaging, color Doppler and duplex ultrasound were performed of bilateral carotid and vertebral arteries in the neck. COMPARISON:  None. FINDINGS: Criteria: Quantification of carotid stenosis is based on velocity parameters that correlate the residual internal carotid diameter with NASCET-based stenosis levels, using the diameter  of the distal internal carotid lumen as the denominator for stenosis measurement. The following velocity measurements were obtained: RIGHT ICA: 48/13 cm/sec CCA: 63/01 cm/sec SYSTOLIC ICA/CCA RATIO:  1.0 ECA: 72 cm/sec LEFT ICA: 38/8 cm/sec CCA: 60/10 cm/sec SYSTOLIC ICA/CCA RATIO:  0.6 ECA: 59 cm/sec RIGHT CAROTID ARTERY: Minor echogenic shadowing plaque formation. No hemodynamically significant right ICA stenosis, velocity elevation, or turbulent flow. Degree of narrowing less than 50%. RIGHT VERTEBRAL ARTERY:  Normal antegrade flow LEFT CAROTID ARTERY: Similar scattered minor echogenic plaque formation. No hemodynamically significant left ICA stenosis, velocity elevation, or turbulent flow. LEFT VERTEBRAL ARTERY:  Normal antegrade flow IMPRESSION: Minor carotid atherosclerosis. No hemodynamically significant ICA stenosis. Degree of narrowing less than 50% bilaterally by ultrasound criteria. Patent antegrade vertebral flow bilaterally Electronically Signed   By: Jerilynn Mages.  Shick M.D.   On: 05/17/2020 10:18   EEG adult  Result Date: 05/17/2020 Lora Havens, MD     05/17/2020  5:30 PM Patient Name: Jacqueline Flores MRN: [id number] Epilepsy Attending: Lora Havens Referring Physician/Provider: Dr Irwin Brakeman Date: 05/17/2020  Duration: 23.30 mins Patient history: 85yo F with syncope. EEG to evaluate for seizure Level of alertness: Awake AEDs during EEG study: None Technical aspects: This EEG study was done with scalp electrodes positioned according to the 10-20 International system of electrode placement. Electrical activity was acquired at a sampling rate of 500Hz  and reviewed with a high frequency filter of 70Hz  and a low frequency filter of 1Hz . EEG data were recorded continuously and digitally stored. Description: The posterior dominant rhythm consists of 7.5-8 Hz activity of moderate voltage (25-35 uV) seen predominantly in posterior head regions, symmetric and reactive to eye opening and eye closing. Continuous generalized 5-6hz  theta slowing was also noted. Hyperventilation and photic stimulation were not performed.   ABNORMALITY - Continuous slow, generalized IMPRESSION: This study is suggestive of mild diffuse encephalopathy, nonspecific etiology. No seizures or epileptiform discharges were seen throughout the recording. Lora Havens   DG Femur Min 2 Views Right  Result Date: 05/16/2020 CLINICAL DATA:  Weakness, fell, right hip pain EXAM: RIGHT FEMUR 2 VIEWS; DG HIP (WITH OR WITHOUT PELVIS) 3-4V BILAT COMPARISON:  None. FINDINGS: Bilateral hips: Frontal view of the pelvis as well as frontal and frogleg lateral views of the bilateral hips are obtained. There are comminuted minimally displaced fractures through the bilateral superior and inferior pubic rami, with mild displacement on the right. No other acute bony abnormalities. Hips are well aligned. Mild symmetrical joint space narrowing. Prominent lower lumbar spondylosis. Right femur: Frontal and lateral views demonstrate an unremarkable right femur. Right hip and knee are well aligned. Soft tissues are unremarkable. IMPRESSION: 1. Bilateral superior and inferior pubic rami fractures, with mild displacement on the right. 2. No other acute bony abnormalities. 3. Lower  lumbar spondylosis. 4. Mild symmetrical hip osteoarthritis. Electronically Signed   By: Randa Ngo M.D.   On: 05/16/2020 22:34   DG Hips Bilat W or Wo Pelvis 3-4 Views  Result Date: 05/16/2020 CLINICAL DATA:  Weakness, fell, right hip pain EXAM: RIGHT FEMUR 2 VIEWS; DG HIP (WITH OR WITHOUT PELVIS) 3-4V BILAT COMPARISON:  None. FINDINGS: Bilateral hips: Frontal view of the pelvis as well as frontal and frogleg lateral views of the bilateral hips are obtained. There are comminuted minimally displaced fractures through the bilateral superior and inferior pubic rami, with mild displacement on the right. No other acute bony abnormalities. Hips are well aligned. Mild symmetrical joint space narrowing. Prominent lower lumbar spondylosis.  Right femur: Frontal and lateral views demonstrate an unremarkable right femur. Right hip and knee are well aligned. Soft tissues are unremarkable. IMPRESSION: 1. Bilateral superior and inferior pubic rami fractures, with mild displacement on the right. 2. No other acute bony abnormalities. 3. Lower lumbar spondylosis. 4. Mild symmetrical hip osteoarthritis. Electronically Signed   By: Randa Ngo M.D.   On: 05/16/2020 22:34    Scheduled Meds: . acetaminophen  650 mg Oral Q6H   Or  . acetaminophen  650 mg Rectal Q6H  . aspirin EC  81 mg Oral Daily  . carbidopa-levodopa  1 tablet Oral QHS  . carbidopa-levodopa  1.5 tablet Oral TID  . heparin  5,000 Units Subcutaneous Q8H  . oxyCODONE  5 mg Oral Q6H  . pravastatin  80 mg Oral QPM  . senna-docusate  2 tablet Oral QHS  . sertraline  50 mg Oral QHS   Continuous Infusions: . sodium chloride 10 mL/hr at 05/18/20 0829     LOS: 1 day   Time spent: 91 mins   Jenette Rayson Wynetta Emery, MD How to contact the [facility] Attending or Consulting provider [place] or covering provider during after hours [place], for this patient?  1. Check the care team in [facility] and look for a) attending/consulting TRH provider listed and b) the [facility] team  listed 2. Log into [email or web address] and use 's universal password to access. If you do not have the password, please contact the hospital operator. 3. Locate the [facility] provider you are looking for under [facility] and page to a number that you can be directly reached. 4. If you still have difficulty reaching the provider, please page the [facility] (Director on Call) for the Hospitalists listed on amion for assistance.  05/18/2020, 3:45 PM

## 2020-05-18 NOTE — Treatment Plan (Signed)
Sister DeLee requested to see MD Wynetta Emery and Education officer, museum. MD informed via secure chat as well as Judene Companion SW. Explained to DeLee that writer could not review EEG results with her and that MD had to explain those at this time. Pt in bed eating a sausage biscuit smiling at this time. Tele D/C.

## 2020-05-18 NOTE — Progress Notes (Signed)
*  PRELIMINARY RESULTS* Echocardiogram 2D Echocardiogram has been performed.  Leavy Cella 05/18/2020, 11:42 AM

## 2020-05-18 NOTE — TOC Progression Note (Signed)
Transition of Care [facility]) - Progression Note    Patient Details  Name: Jacqueline Flores MRN: [id number] Date of Birth: 26-Jun-1935  Transition of Care [facility]) CM/SW Contact  Shade Flood, LCSW Phone Number: 05/18/2020, 2:11 PM  Clinical Narrative:     TOC following. Insurance auth for SNF is still pending. Spoke with pt's daughter today to update and answer questions about resource options for once pt is able to return home.  Assigned TOC will follow.  Expected Discharge Plan: Skilled Nursing Facility Barriers to Discharge: Insurance Authorization  Expected Discharge Plan and Services Expected Discharge Plan: [place] In-house Referral: Clinical Social Work   Post Acute Care Choice: [place] Living arrangements for the past 2 months: Single Family Home                                       Social Determinants of Health (SDOH) Interventions    Readmission Risk Interventions No flowsheet data found.

## 2020-05-19 DIAGNOSIS — I119 Hypertensive heart disease without heart failure: Secondary | ICD-10-CM | POA: Diagnosis not present

## 2020-05-19 DIAGNOSIS — N179 Acute kidney failure, unspecified: Secondary | ICD-10-CM | POA: Diagnosis not present

## 2020-05-19 DIAGNOSIS — Z9181 History of falling: Secondary | ICD-10-CM | POA: Diagnosis not present

## 2020-05-19 DIAGNOSIS — S32591D Other specified fracture of right pubis, subsequent encounter for fracture with routine healing: Secondary | ICD-10-CM | POA: Diagnosis not present

## 2020-05-19 LAB — BASIC METABOLIC PANEL
Anion gap: 8 (ref 5–15)
BUN: 20 mg/dL (ref 8–23)
CO2: 25 mmol/L (ref 22–32)
Calcium: 8.5 mg/dL — ABNORMAL LOW (ref 8.9–10.3)
Chloride: 104 mmol/L (ref 98–111)
Creatinine, Ser: 0.93 mg/dL (ref 0.44–1.00)
GFR, Estimated: >60 mL/min (ref >60)
Glucose, Bld: 102 mg/dL — ABNORMAL HIGH (ref 70–99)
Potassium: 3.5 mmol/L (ref 3.5–5.1)
Sodium: 137 mmol/L (ref 135–145)

## 2020-05-19 NOTE — Progress Notes (Signed)
PROGRESS NOTE   JERRE DIGUGLIELMO  WTU:[id number] DOB: 07/30/1935 DOA: 05/16/2020 PCP: Mayra Neer, MD   Chief Complaint  Patient presents with  . Loss of Consciousness   Level of care: Med-Surg  Brief Admission History:  85 y.o. female, with history of Parkinson's disease, myocardial infarction, hypertension, hyperlipidemia, GERD, [name] presents to the ED with a chief complaint of fall.  Patient is confused, waxing and waning confusion has been normal for her recently, so daughter provides most of history.  Daughter reports that patient has been having vagal syncopal episodes.  The last one was at the beginning of March.  Today patient hit her life alert button and when daughter got over there she was diaphoretic, and altered from baseline.  Daughter describes her as groggy, slumped over, mumbling incoherently.  Blood pressure was 110/78, but when they tried to stand her up it dropped.  Daughter reports it is normal for her to have these episodes after syncope where she is altered from baseline.  About an hour after her syncopal episode she was closer to her baseline mentation so family try to stand her up.  She complained that her right leg.  She could not weight-bear.  Is normal for her to have cramps in her legs but she is usually able to bear weight.  The decision was made to get patient to the ER.  Of note patient has no history of seizures.  Patient herself does not remember much of the event.  She reports that she was walking out of the bathroom with a cup of tea- which is abnormal and she is unable to explain-without her walker when she lost her balance and fell.  She thinks she hit her head.  She is not sure if she lost consciousness.  She not aware of any preceding symptoms, but overall is a difficult historian due to her confusion.  She was started on a new medication 3 weeks ago-Zoloft.  Her last syncopal episode was before the Zoloft was started.  Patient is a former smoker, does not  drink alcohol, does not use illicit drugs.  She is vaccinated for COVID.  Patient is DNR.  In the ED Temp 97.8, heart rate 64, respiratory rate 18, blood pressure 151/82, satting at 94% Leukocytosis with white blood cell count 13.2, hemoglobin 13.0 Chemistry panel shows a creatinine of 1.64 CT head shows advanced senescent changes similar to that seen on prior exam with no acute abnormality or fracture  EKG shows a heart rate of 68, sinus rhythm, QTC 426 Bilateral superior and inferior pubic rami fractures with mild displacement on right as well as lower lumbar spondylosis shown on x-ray Right femur is unremarkable with right hip and knee well aligned Patient was adamant that she wanted to go home, but was not able to ambulate in the ER so admission requested for pain control and PT evaluation  Assessment & Plan:   Principal Problem:   Bilateral fracture of pubic rami (HCC) Active Problems:   Coronary atherosclerosis   Benign hypertensive heart disease without heart failure   Bilateral dry eyes   HLD (hyperlipidemia)   GERD (gastroesophageal reflux disease)   Syncope and collapse   Chronic pain   At high risk for falls   Orthostatic hypotension   Acute kidney injury ([place])   Bilateral pubic rami fracture - discussed with orthopedics, conservative management, pain control and physical therapy recommended. Goal at this time is pain control and ambulation.  PT eval requested and they  are recommending SNF placement.   Orthostatic hypotension and recurrent syncope - Pt was clinically dehydrated and has responded well to gentle IV fluid hydration. Fall precautions recommended.  Possibly exacerbated by Mild to moderate aortic stenosis seen on 2D Echocardiogram.  Pt will need SNF level care after discharge from hospital.  Carotid dopplers - no hemodynamically significant stenosis.   Echocardiogram:  Left ventricular ejection fraction, by estimation, is 70 to 75%. The left ventricle has  hyperdynamic function. The left ventricle has no regional wall motion abnormalities. There is moderate left ventricular hypertrophy. Left ventricular diastolic parameters are consistent with Grade I diastolic dysfunction (impaired relaxation).  2. Right ventricular systolic function is mildly reduced. The right ventricular size is normal. Mildly increased right ventricular wall thickness.  3. The mitral valve is normal in structure. Trivial mitral valve regurgitation.  4. Tricuspid valve regurgitation is mild to moderate.  5. AV is thickened, calcified with mildly restricted motion Peak and mean gradients through the valve are 14 and 7 mm Hg respectively. The aortic valve is tricuspid. Aortic valve regurgitation is not visualized. Mild to moderate aortic valve sclerosis/calcification is present, without any evidence of aortic stenosis.  6. The inferior vena cava is normal in size with greater than 50% respiratory variability, suggesting right atrial pressure of 3 mmHg. FINDINGS  Left Ventricle: Left ventricular ejection fraction, by estimation, is 70 to 75%. The left ventricle has hyperdynamic function. The left ventricle has no regional wall motion abnormalities. The left ventricular internal cavity size was normal in size. There is moderate left ventricular hypertrophy. Left ventricular diastolic parameters are consistent with Grade I diastolic dysfunction (impaired relaxation). .   Hypokalemia - this has been repleted orally.  Hypomagnesemia - This has been replaced.   AKI - prerenal - resolved after IV fluids.   Leukocytosis - reactive from fall and acute fracture - this has resolved.   Depression - resumed home sertraline.    GERD - ordered protonix for GI protection.   Chronic pain - better controlled on current regimen. Added peri-colace daily for opioid induced constipation.    DVT prophylaxis: SQ heparin Code Status: full  Family Communication: daughter bedside Disposition: awaiting SNF  placement  Status is: Inpatient  Remains inpatient appropriate because:Unsafe d/c plan, IV treatments appropriate due to intensity of illness or inability to take PO and Inpatient level of care appropriate due to severity of illness   Dispo: The patient is from: Home              Anticipated d/c is to: SNF              Patient currently is not medically stable to d/c.   Difficult to place patient No   Consultants:     Procedures:     Antimicrobials:     Subjective: Pt sitting up in chair, she is eating and drinking today.  .   Objective: Vitals:   05/18/20 1252 05/18/20 2117 05/19/20 0440 05/19/20 1004  BP: (!) 160/92 (!) 162/84 (!) 172/91 137/82  Pulse: 70 69 66 71  Resp: 17 14 16    Temp: 97.6 F (36.4 C) (!) 97.3 F (36.3 C) (!) 97.5 F (36.4 C)   TempSrc: Oral Oral Oral   SpO2: 93% 94% 97%   Weight:   64.9 kg   Height:        Intake/Output Summary (Last 24 hours) at 05/19/2020 1419 Last data filed at 05/19/2020 1100 Gross per 24 hour  Intake 1671.81 ml  Output 1000 ml  Net 671.81 ml   Filed Weights   05-23-20 0213 05/18/20 0700 05/19/20 0440  Weight: 61.3 kg 65.3 kg 64.9 kg    Examination:  General exam: frail, elderly female lying supine in bed, alert, oriented to person, place, Appears calm and mildly uncomfortable.  Respiratory system: Clear to auscultation. Respiratory effort normal. Cardiovascular system: normal S1 & S2 heard. No JVD, murmurs, rubs, gallops or clicks. No pedal edema. Gastrointestinal system: Abdomen is nondistended, soft and nontender. No organomegaly or masses felt. Normal bowel sounds heard. Central nervous system: Alert and oriented. No focal neurological deficits. Extremities: bilateral hip pain, diminished mobility due to pain. Skin: No rashes, lesions or ulcers Psychiatry: Judgement and insight appear poor. Mood & affect appropriate.   Data Reviewed: I have personally reviewed following labs and imaging  studies  CBC: Recent Labs  Lab 05/16/20 2200 May 23, 2020 0437 05/18/20 0441  WBC 13.2* 11.2* 8.5  NEUTROABS  --  9.1*  --   HGB 13.0 12.0 10.7*  HCT 41.1 38.6 33.6*  MCV 90.9 91.0 90.1  PLT 225 188 676    Basic Metabolic Panel: Recent Labs  Lab 05/16/20 2200 2020-05-23 0437 05/18/20 0441 05/19/20 0504  NA 136 138 137 137  K 3.4* 3.7 3.2* 3.5  CL 99 102 104 104  CO2 25 23 23 25   GLUCOSE 126* 111* 90 102*  BUN 26* 28* 23 20  CREATININE 1.64* 1.33* 0.97 0.93  CALCIUM 9.1 9.1 8.4* 8.5*  MG  --  1.9  --   --     GFR: Estimated Creatinine Clearance: 41.5 mL/min (by C-G formula based on SCr of 0.93 mg/dL).  Liver Function Tests: Recent Labs  Lab 05/16/20 2200 May 23, 2020 0437  AST 23 19  ALT 6 7  ALKPHOS 96 91  BILITOT 0.7 0.8  PROT 7.9 6.9  ALBUMIN 3.9 3.5    CBG: No results for input(s): GLUCAP in the last 168 hours.  Recent Results (from the past 240 hour(s))  SARS CORONAVIRUS 2 (TAT 6-24 HRS) Nasopharyngeal Nasopharyngeal Swab     Status: None   Collection Time: 05/16/20 11:02 PM   Specimen: Nasopharyngeal Swab  Result Value Ref Range Status   SARS Coronavirus 2 NEGATIVE NEGATIVE Final    Comment: (NOTE) SARS-CoV-2 target nucleic acids are NOT DETECTED.  The SARS-CoV-2 RNA is generally detectable in upper and lower respiratory specimens during the acute phase of infection. Negative results do not preclude SARS-CoV-2 infection, do not rule out co-infections with other pathogens, and should not be used as the sole basis for treatment or other patient management decisions. Negative results must be combined with clinical observations, patient history, and epidemiological information. The expected result is Negative.  Fact Sheet for Patients: SugarRoll.be  Fact Sheet for Healthcare Providers: https://www.woods-mathews.com/  This test is not yet approved or cleared by the [place] FDA and  has been authorized  for detection and/or diagnosis of SARS-CoV-2 by FDA under an Emergency Use Authorization (EUA). This EUA will remain  in effect (meaning this test can be used) for the duration of the COVID-19 declaration under Se ction 564(b)(1) of the Act, 21 U.S.C. section 360bbb-3(b)(1), unless the authorization is terminated or revoked sooner.  Performed at [facility], [place] [address], [place], [place] [address]      Radiology Studies: EEG adult  Result Date: 2020/05/23 Lora Havens, MD     2020-05-23  5:30 PM Patient Name: MAEBY VANKLEECK MRN: [id number] Epilepsy Attending: Marcelle Overlie  Barbra Sarks Referring Physician/Provider: Dr Irwin Brakeman Date: 05/17/2020 Duration: 23.30 mins Patient history: 85yo F with syncope. EEG to evaluate for seizure Level of alertness: Awake AEDs during EEG study: None Technical aspects: This EEG study was done with scalp electrodes positioned according to the 10-20 International system of electrode placement. Electrical activity was acquired at a sampling rate of 500Hz  and reviewed with a high frequency filter of 70Hz  and a low frequency filter of 1Hz . EEG data were recorded continuously and digitally stored. Description: The posterior dominant rhythm consists of 7.5-8 Hz activity of moderate voltage (25-35 uV) seen predominantly in posterior head regions, symmetric and reactive to eye opening and eye closing. Continuous generalized 5-6hz  theta slowing was also noted. Hyperventilation and photic stimulation were not performed.   ABNORMALITY - Continuous slow, generalized IMPRESSION: This study is suggestive of mild diffuse encephalopathy, nonspecific etiology. No seizures or epileptiform discharges were seen throughout the recording. Lora Havens   ECHOCARDIOGRAM COMPLETE  Result Date: 05/18/2020    ECHOCARDIOGRAM REPORT   Patient Name:   EMMALISE HUARD Date of Exam: 05/18/2020 Medical Rec #:  [id number]         Height:       63.0 in Accession #:    [id number]         Weight:       144.0 lb Date of Birth:  1935/07/24         BSA:          1.681 m Patient Age:    68 years          BP:           165/88 mmHg Patient Gender: F                 HR:           70 bpm. Exam Location:  [facility] Procedure: 2D Echo Indications:    Syncope R55  History:        Patient has no prior history of Echocardiogram examinations. CAD                 and Previous Myocardial Infarction, Signs/Symptoms:Syncope; Risk                 Factors:Former Smoker, Hypertension and Dyslipidemia. GERD.  Sonographer:    Leavy Cella RDCS (AE) Referring Phys: [id number] ASIA B [place]  1. Left ventricular ejection fraction, by estimation, is 70 to 75%. The left ventricle has hyperdynamic function. The left ventricle has no regional wall motion abnormalities. There is moderate left ventricular hypertrophy. Left ventricular diastolic parameters are consistent with Grade I diastolic dysfunction (impaired relaxation).  2. Right ventricular systolic function is mildly reduced. The right ventricular size is normal. Mildly increased right ventricular wall thickness.  3. The mitral valve is normal in structure. Trivial mitral valve regurgitation.  4. Tricuspid valve regurgitation is mild to moderate.  5. AV is thickened, calcified with mildly restricted motion Peak and mean gradients through the valve are 14 and 7 mm Hg respectively. The aortic valve is tricuspid. Aortic valve regurgitation is not visualized. Mild to moderate aortic valve sclerosis/calcification is present, without any evidence of aortic stenosis.  6. The inferior vena cava is normal in size with greater than 50% respiratory variability, suggesting right atrial pressure of 3 mmHg. FINDINGS  Left Ventricle: Left ventricular ejection fraction, by estimation, is 70 to 75%. The left ventricle has hyperdynamic function. The left ventricle has no regional wall motion  abnormalities. The left ventricular internal cavity size was normal in size.  There is moderate left ventricular hypertrophy. Left ventricular diastolic parameters are consistent with Grade I diastolic dysfunction (impaired relaxation). Right Ventricle: The right ventricular size is normal. Mildly increased right ventricular wall thickness. Right ventricular systolic function is mildly reduced. Left Atrium: Left atrial size was normal in size. Right Atrium: Right atrial size was normal in size. Pericardium: There is no evidence of pericardial effusion. Mitral Valve: The mitral valve is normal in structure. Trivial mitral valve regurgitation. Tricuspid Valve: The tricuspid valve is normal in structure. Tricuspid valve regurgitation is mild to moderate. Aortic Valve: AV is thickened, calcified with mildly restricted motion Peak and mean gradients through the valve are 14 and 7 mm Hg respectively. The aortic valve is tricuspid. Aortic valve regurgitation is not visualized. Mild to moderate aortic valve sclerosis/calcification is present, without any evidence of aortic stenosis. Pulmonic Valve: The pulmonic valve was not well visualized. Pulmonic valve regurgitation is not visualized. Aorta: The aortic root is normal in size and structure. Venous: The inferior vena cava is normal in size with greater than 50% respiratory variability, suggesting right atrial pressure of 3 mmHg. IAS/Shunts: No atrial level shunt detected by color flow Doppler.  LEFT VENTRICLE PLAX 2D LVIDd:         3.52 cm  Diastology LVIDs:         1.93 cm  LV e' medial:    5.55 cm/s LV PW:         1.63 cm  LV E/e' medial:  11.9 LV IVS:        1.44 cm  LV e' lateral:   4.90 cm/s LVOT diam:     2.00 cm  LV E/e' lateral: 13.5 LVOT Area:     3.14 cm  RIGHT VENTRICLE RV S prime:     22.10 cm/s TAPSE (M-mode): 2.5 cm LEFT ATRIUM             Index       RIGHT ATRIUM           Index LA diam:        2.50 cm 1.49 cm/m  RA Area:     13.10 cm LA Vol (A2C):   40.2 ml 23.91 ml/m RA Volume:   27.60 ml  16.41 ml/m LA Vol (A4C):   41.1 ml  24.44 ml/m LA Biplane Vol: 41.9 ml 24.92 ml/m   AORTA Ao Root diam: 2.50 cm MITRAL VALVE                TRICUSPID VALVE MV Area (PHT): 2.83 cm     TR Peak grad:   55.7 mmHg MV Decel Time: 268 msec     TR Vmax:        373.00 cm/s MV E velocity: 66.00 cm/s MV A velocity: 102.00 cm/s  SHUNTS MV E/A ratio:  0.65         Systemic Diam: 2.00 cm Dorris Carnes MD Electronically signed by Dorris Carnes MD Signature Date/Time: 05/18/2020/4:50:09 PM    Final     Scheduled Meds: . acetaminophen  650 mg Oral Q6H   Or  . acetaminophen  650 mg Rectal Q6H  . aspirin EC  81 mg Oral Daily  . carbidopa-levodopa  1 tablet Oral QHS  . carbidopa-levodopa  1.5 tablet Oral TID  . heparin  5,000 Units Subcutaneous Q8H  . pantoprazole  40 mg Oral Q0600  . pravastatin  80 mg Oral QPM  .  senna-docusate  2 tablet Oral QHS  . sertraline  50 mg Oral QHS   Continuous Infusions: . sodium chloride 10 mL/hr at 05/18/20 1657     LOS: 2 days   Time spent: 35 mins   Rishik Tubby Wynetta Emery, MD How to contact the [facility] Attending or Consulting provider [place] or covering provider during after hours [place], for this patient?  1. Check the care team in [facility] and look for a) attending/consulting [facility] provider listed and b) the [facility] team listed 2. Log into [email or web address] and use [place]'s universal password to access. If you do not have the password, please contact the hospital operator. 3. Locate the [facility] provider you are looking for under [facility] and page to a number that you can be directly reached. 4. If you still have difficulty reaching the provider, please page the [facility] (Director on Call) for the Hospitalists listed on amion for assistance.  05/19/2020, 2:19 PM

## 2020-05-20 DIAGNOSIS — S32591D Other specified fracture of right pubis, subsequent encounter for fracture with routine healing: Secondary | ICD-10-CM | POA: Diagnosis not present

## 2020-05-20 DIAGNOSIS — I119 Hypertensive heart disease without heart failure: Secondary | ICD-10-CM | POA: Diagnosis not present

## 2020-05-20 DIAGNOSIS — N179 Acute kidney failure, unspecified: Secondary | ICD-10-CM | POA: Diagnosis not present

## 2020-05-20 DIAGNOSIS — I10 Essential (primary) hypertension: Secondary | ICD-10-CM

## 2020-05-20 DIAGNOSIS — Z9181 History of falling: Secondary | ICD-10-CM | POA: Diagnosis not present

## 2020-05-20 MED ORDER — HYDRALAZINE HCL 20 MG/ML IJ SOLN: 10.0000 mg | INTRAMUSCULAR | Status: DC | PRN

## 2020-05-20 MED ORDER — AMLODIPINE BESYLATE 5 MG PO TABS
5.0000 mg | ORAL_TABLET | Freq: Every day | ORAL | Status: DC
  Administered 2020-05-20 – 2020-05-21 (×2): 5 mg via ORAL
  Filled 2020-05-20 (×2): qty 1

## 2020-05-20 NOTE — Care Plan (Signed)
Pts husband alerted staff that patient pulled out purewick at this time, upon assessment pt playing with purewick and urinated in chair at this time upon writer going into room. Pt cleansed at this time, gown changed, chair cleansed at this time, and patient placed into bed. Pt reoriented to where she is at this time, pt apologizing at this time.

## 2020-05-20 NOTE — Progress Notes (Addendum)
Messaged on call provider about pts BP 185/99. Awaiting PRN orders.  0627 continue to monitor BP, per on call.

## 2020-05-20 NOTE — Progress Notes (Signed)
PROGRESS NOTE   Jacqueline Flores  GYI:[id number] DOB: 02-Jun-1935 DOA: 05/16/2020 PCP: Mayra Neer, MD   Chief Complaint  Patient presents with  . Loss of Consciousness   Level of care: Med-Surg  Brief Admission History:  85 y.o. female, with history of Parkinson's disease, myocardial infarction, hypertension, hyperlipidemia, GERD, [name] presents to the ED with a chief complaint of fall.  Patient is confused, waxing and waning confusion has been normal for her recently, so daughter provides most of history.  Daughter reports that patient has been having vagal syncopal episodes.  The last one was at the beginning of March.  Today patient hit her life alert button and when daughter got over there she was diaphoretic, and altered from baseline.  Daughter describes her as groggy, slumped over, mumbling incoherently.  Blood pressure was 110/78, but when they tried to stand her up it dropped.  Daughter reports it is normal for her to have these episodes after syncope where she is altered from baseline.  About an hour after her syncopal episode she was closer to her baseline mentation so family try to stand her up.  She complained that her right leg.  She could not weight-bear.  Is normal for her to have cramps in her legs but she is usually able to bear weight.  The decision was made to get patient to the ER.  Of note patient has no history of seizures.  Patient herself does not remember much of the event.  She reports that she was walking out of the bathroom with a cup of tea- which is abnormal and she is unable to explain-without her walker when she lost her balance and fell.  She thinks she hit her head.  She is not sure if she lost consciousness.  She not aware of any preceding symptoms, but overall is a difficult historian due to her confusion.  She was started on a new medication 3 weeks ago-Zoloft.  Her last syncopal episode was before the Zoloft was started.  Patient is a former smoker, does not  drink alcohol, does not use illicit drugs.  She is vaccinated for COVID.  Patient is DNR.  In the ED Temp 97.8, heart rate 64, respiratory rate 18, blood pressure 151/82, satting at 94% Leukocytosis with white blood cell count 13.2, hemoglobin 13.0 Chemistry panel shows a creatinine of 1.64 CT head shows advanced senescent changes similar to that seen on prior exam with no acute abnormality or fracture  EKG shows a heart rate of 68, sinus rhythm, QTC 426 Bilateral superior and inferior pubic rami fractures with mild displacement on right as well as lower lumbar spondylosis shown on x-ray Right femur is unremarkable with right hip and knee well aligned Patient was adamant that she wanted to go home, but was not able to ambulate in the ER so admission requested for pain control and PT evaluation  Assessment & Plan:   Principal Problem:   Bilateral fracture of pubic rami (HCC) Active Problems:   Coronary atherosclerosis   Benign hypertensive heart disease without heart failure   Bilateral dry eyes   HLD (hyperlipidemia)   GERD (gastroesophageal reflux disease)   Syncope and collapse   Chronic pain   At high risk for falls   Orthostatic hypotension   Acute kidney injury (Opp)   Bilateral pubic rami fracture - discussed with orthopedics, conservative management, pain control and physical therapy recommended. Goal at this time is pain control and ambulation.  PT eval requested and they  are recommending SNF placement.   Orthostatic hypotension and recurrent syncope - Pt was clinically dehydrated and has responded well to gentle IV fluid hydration. Fall precautions recommended.  Possibly exacerbated by Mild to moderate aortic stenosis seen on 2D Echocardiogram.  Pt will need SNF level care after discharge from hospital.  Carotid dopplers - no hemodynamically significant stenosis.   Echocardiogram:  Left ventricular ejection fraction, by estimation, is 70 to 75%. The left ventricle has  hyperdynamic function. The left ventricle has no regional wall motion abnormalities. There is moderate left ventricular hypertrophy. Left ventricular diastolic parameters are consistent with Grade I diastolic dysfunction (impaired relaxation).  2. Right ventricular systolic function is mildly reduced. The right ventricular size is normal. Mildly increased right ventricular wall thickness.  3. The mitral valve is normal in structure. Trivial mitral valve regurgitation.  4. Tricuspid valve regurgitation is mild to moderate.  5. AV is thickened, calcified with mildly restricted motion Peak and mean gradients through the valve are 14 and 7 mm Hg respectively. The aortic valve is tricuspid. Aortic valve regurgitation is not visualized. Mild to moderate aortic valve sclerosis/calcification is present, without any evidence of aortic stenosis.  6. The inferior vena cava is normal in size with greater than 50% respiratory variability, suggesting right atrial pressure of 3 mmHg. FINDINGS  Left Ventricle: Left ventricular ejection fraction, by estimation, is 70 to 75%. The left ventricle has hyperdynamic function. The left ventricle has no regional wall motion abnormalities. The left ventricular internal cavity size was normal in size. There is moderate left ventricular hypertrophy. Left ventricular diastolic parameters are consistent with Grade I diastolic dysfunction (impaired relaxation). .   Hypokalemia - this has been repleted orally.  Hypomagnesemia - This has been replaced.   AKI - prerenal - resolved after IV fluids.   Leukocytosis - reactive from fall and acute fracture - this has resolved.   Depression - resumed home sertraline.    GERD - ordered protonix for GI protection.   Chronic pain - better controlled on current regimen. Added peri-colace daily for opioid induced constipation.   Essential hypertension - blood pressures elevated on several occasions, adding amlodipine 5 mg daily and hydralazine IV  prn SBP>160.     DVT prophylaxis: SQ heparin Code Status: full  Family Communication: daughter bedside Disposition: awaiting SNF placement  Status is: Inpatient  Remains inpatient appropriate because:Unsafe d/c plan, IV treatments appropriate due to intensity of illness or inability to take PO and Inpatient level of care appropriate due to severity of illness   Dispo: The patient is from: Home              Anticipated d/c is to: SNF              Patient currently is not medically stable to d/c.   Difficult to place patient No   Consultants:     Procedures:     Antimicrobials:     Subjective: Pt sitting up in chair today as well and reports that she is feeling better as pain seems better controlled.   Objective: Vitals:   05/19/20 2048 05/20/20 0500 05/20/20 0557 05/20/20 1126  BP: (!) 171/89  (!) 185/99 (!) 150/90  Pulse: 72  66 70  Resp: 18  20   Temp: 98.1 F (36.7 C)  97.8 F (36.6 C)   TempSrc:      SpO2: 97%  95%   Weight:  65 kg    Height:  Intake/Output Summary (Last 24 hours) at 05/20/2020 1149 Last data filed at 05/20/2020 0745 Gross per 24 hour  Intake 720 ml  Output 1000 ml  Net -280 ml   Filed Weights   05/18/20 0700 05/19/20 0440 05/20/20 0500  Weight: 65.3 kg 64.9 kg 65 kg    Examination:  General exam: frail, elderly female lying supine in bed, alert, oriented to person, place, Appears calm and mildly uncomfortable.  Respiratory system: Clear to auscultation. Respiratory effort normal. Cardiovascular system: normal S1 & S2 heard. No JVD, murmurs, rubs, gallops or clicks. No pedal edema. Gastrointestinal system: Abdomen is nondistended, soft and nontender. No organomegaly or masses felt. Normal bowel sounds heard. Central nervous system: Alert and oriented. No focal neurological deficits. Extremities: improving bilateral hip pain, diminished mobility due to pain. Skin: No rashes, lesions or ulcers Psychiatry: Judgement and insight  appear poor. Mood & affect appropriate.   Data Reviewed: I have personally reviewed following labs and imaging studies  CBC: Recent Labs  Lab 05/16/20 2200 05/17/20 0437 05/18/20 0441  WBC 13.2* 11.2* 8.5  NEUTROABS  --  9.1*  --   HGB 13.0 12.0 10.7*  HCT 41.1 38.6 33.6*  MCV 90.9 91.0 90.1  PLT 225 188 102    Basic Metabolic Panel: Recent Labs  Lab 05/16/20 2200 05/17/20 0437 05/18/20 0441 05/19/20 0504  NA 136 138 137 137  K 3.4* 3.7 3.2* 3.5  CL 99 102 104 104  CO2 25 23 23 25   GLUCOSE 126* 111* 90 102*  BUN 26* 28* 23 20  CREATININE 1.64* 1.33* 0.97 0.93  CALCIUM 9.1 9.1 8.4* 8.5*  MG  --  1.9  --   --     GFR: Estimated Creatinine Clearance: 41.5 mL/min (by C-G formula based on SCr of 0.93 mg/dL).  Liver Function Tests: Recent Labs  Lab 05/16/20 2200 05/17/20 0437  AST 23 19  ALT 6 7  ALKPHOS 96 91  BILITOT 0.7 0.8  PROT 7.9 6.9  ALBUMIN 3.9 3.5    CBG: No results for input(s): GLUCAP in the last 168 hours.  Recent Results (from the past 240 hour(s))  SARS CORONAVIRUS 2 (TAT 6-24 HRS) Nasopharyngeal Nasopharyngeal Swab     Status: None   Collection Time: 05/16/20 11:02 PM   Specimen: Nasopharyngeal Swab  Result Value Ref Range Status   SARS Coronavirus 2 NEGATIVE NEGATIVE Final    Comment: (NOTE) SARS-CoV-2 target nucleic acids are NOT DETECTED.  The SARS-CoV-2 RNA is generally detectable in upper and lower respiratory specimens during the acute phase of infection. Negative results do not preclude SARS-CoV-2 infection, do not rule out co-infections with other pathogens, and should not be used as the sole basis for treatment or other patient management decisions. Negative results must be combined with clinical observations, patient history, and epidemiological information. The expected result is Negative.  Fact Sheet for Patients: SugarRoll.be  Fact Sheet for Healthcare  Providers: https://www.woods-mathews.com/  This test is not yet approved or cleared by the [place] FDA and  has been authorized for detection and/or diagnosis of SARS-CoV-2 by FDA under an Emergency Use Authorization (EUA). This EUA will remain  in effect (meaning this test can be used) for the duration of the COVID-19 declaration under Se ction 564(b)(1) of the Act, 21 U.S.C. section 360bbb-3(b)(1), unless the authorization is terminated or revoked sooner.  Performed at [facility], [place] [address], [place], [place] [address]      Radiology Studies: No results found.  Scheduled Meds: . acetaminophen  650 mg Oral Q6H   Or  . acetaminophen  650 mg Rectal Q6H  . amLODipine  5 mg Oral Daily  . aspirin EC  81 mg Oral Daily  . carbidopa-levodopa  1 tablet Oral QHS  . carbidopa-levodopa  1.5 tablet Oral TID  . heparin  5,000 Units Subcutaneous Q8H  . pantoprazole  40 mg Oral Q0600  . pravastatin  80 mg Oral QPM  . senna-docusate  2 tablet Oral QHS  . sertraline  50 mg Oral QHS   Continuous Infusions: . sodium chloride 10 mL/hr at 05/20/20 0605     LOS: 3 days   Time spent: 35 mins   Naoko Diperna Wynetta Emery, MD How to contact the [facility] Attending or Consulting provider [place] or covering provider during after hours [place], for this patient?  1. Check the care team in [facility] and look for a) attending/consulting [facility] provider listed and b) the [facility] team listed 2. Log into [email or web address] and use [place]'s universal password to access. If you do not have the password, please contact the hospital operator. 3. Locate the [facility] provider you are looking for under [facility] and page to a number that you can be directly reached. 4. If you still have difficulty reaching the provider, please page the [facility] (Director on Call) for the Hospitalists listed on amion for assistance.  05/20/2020, 11:49 AM

## 2020-05-20 NOTE — Care Plan (Signed)
Spouse left at this time

## 2020-05-21 DIAGNOSIS — S32592A Other specified fracture of left pubis, initial encounter for closed fracture: Secondary | ICD-10-CM | POA: Diagnosis not present

## 2020-05-21 DIAGNOSIS — R55 Syncope and collapse: Secondary | ICD-10-CM | POA: Diagnosis not present

## 2020-05-21 DIAGNOSIS — G20 Parkinson's disease: Secondary | ICD-10-CM | POA: Diagnosis not present

## 2020-05-21 DIAGNOSIS — Z9181 History of falling: Secondary | ICD-10-CM | POA: Diagnosis not present

## 2020-05-21 DIAGNOSIS — H04123 Dry eye syndrome of bilateral lacrimal glands: Secondary | ICD-10-CM

## 2020-05-21 DIAGNOSIS — I2510 Atherosclerotic heart disease of native coronary artery without angina pectoris: Secondary | ICD-10-CM | POA: Diagnosis not present

## 2020-05-21 DIAGNOSIS — I7090 Unspecified atherosclerosis: Secondary | ICD-10-CM | POA: Diagnosis not present

## 2020-05-21 DIAGNOSIS — I951 Orthostatic hypotension: Secondary | ICD-10-CM | POA: Diagnosis not present

## 2020-05-21 DIAGNOSIS — K219 Gastro-esophageal reflux disease without esophagitis: Secondary | ICD-10-CM | POA: Diagnosis not present

## 2020-05-21 DIAGNOSIS — R2689 Other abnormalities of gait and mobility: Secondary | ICD-10-CM | POA: Diagnosis not present

## 2020-05-21 DIAGNOSIS — Z7401 Bed confinement status: Secondary | ICD-10-CM | POA: Diagnosis not present

## 2020-05-21 DIAGNOSIS — R41841 Cognitive communication deficit: Secondary | ICD-10-CM | POA: Diagnosis not present

## 2020-05-21 DIAGNOSIS — S32591D Other specified fracture of right pubis, subsequent encounter for fracture with routine healing: Secondary | ICD-10-CM | POA: Diagnosis not present

## 2020-05-21 DIAGNOSIS — W19XXXA Unspecified fall, initial encounter: Secondary | ICD-10-CM | POA: Diagnosis not present

## 2020-05-21 DIAGNOSIS — S32591A Other specified fracture of right pubis, initial encounter for closed fracture: Secondary | ICD-10-CM | POA: Diagnosis not present

## 2020-05-21 DIAGNOSIS — I10 Essential (primary) hypertension: Secondary | ICD-10-CM | POA: Diagnosis not present

## 2020-05-21 DIAGNOSIS — E7849 Other hyperlipidemia: Secondary | ICD-10-CM | POA: Diagnosis not present

## 2020-05-21 DIAGNOSIS — M6281 Muscle weakness (generalized): Secondary | ICD-10-CM | POA: Diagnosis not present

## 2020-05-21 DIAGNOSIS — S32502A Unspecified fracture of left pubis, initial encounter for closed fracture: Secondary | ICD-10-CM | POA: Diagnosis not present

## 2020-05-21 DIAGNOSIS — S32501A Unspecified fracture of right pubis, initial encounter for closed fracture: Secondary | ICD-10-CM | POA: Diagnosis not present

## 2020-05-21 DIAGNOSIS — N179 Acute kidney failure, unspecified: Secondary | ICD-10-CM | POA: Diagnosis not present

## 2020-05-21 DIAGNOSIS — Z743 Need for continuous supervision: Secondary | ICD-10-CM | POA: Diagnosis not present

## 2020-05-21 DIAGNOSIS — I119 Hypertensive heart disease without heart failure: Secondary | ICD-10-CM | POA: Diagnosis not present

## 2020-05-21 DIAGNOSIS — S32592D Other specified fracture of left pubis, subsequent encounter for fracture with routine healing: Secondary | ICD-10-CM | POA: Diagnosis not present

## 2020-05-21 LAB — RESP PANEL BY RT-PCR (FLU A&B, COVID) ARPGX2
Influenza A by PCR: NEGATIVE
Influenza B by PCR: NEGATIVE
SARS Coronavirus 2 by RT PCR: NEGATIVE

## 2020-05-21 LAB — SARS CORONAVIRUS 2 (TAT 6-24 HRS): SARS Coronavirus 2: NEGATIVE

## 2020-05-21 MED ORDER — SENNOSIDES-DOCUSATE SODIUM 8.6-50 MG PO TABS: 2.0000 | ORAL_TABLET | Freq: Every evening | ORAL | Status: AC | PRN

## 2020-05-21 MED ORDER — CARBIDOPA-LEVODOPA 25-100 MG PO TABS: 2.0000 | ORAL_TABLET | Freq: Three times a day (TID) | ORAL | Status: AC

## 2020-05-21 MED ORDER — ACETAMINOPHEN 325 MG PO TABS: 650.0000 mg | ORAL_TABLET | Freq: Four times a day (QID) | ORAL | Status: AC

## 2020-05-21 MED ORDER — AMLODIPINE BESYLATE 5 MG PO TABS: 5.0000 mg | ORAL_TABLET | Freq: Every day | ORAL | Status: AC

## 2020-05-21 MED ORDER — TRAMADOL HCL 50 MG PO TABS: 50.0000 mg | ORAL_TABLET | Freq: Four times a day (QID) | ORAL | 0 refills | Status: AC | PRN

## 2020-05-21 NOTE — Discharge Summary (Addendum)
Physician Discharge Summary  Jacqueline Flores IEP:[id number] DOB: Jun 06, 1935 DOA: 05/16/2020  PCP: Mayra Neer, MD  Admit date: 05/16/2020 Discharge date: 05/21/2020  Admitted From:  Home  Disposition: SNF   Recommendations for Outpatient Follow-up:  1. Follow up with orthopedics outpatient 2. Please titrate BP meds as needed for better BP control  Discharge Condition: STABLE   CODE STATUS: DNR  DIET: heart healthy   Brief Hospitalization Summary: Please see all hospital notes, images, labs for full details of the hospitalization. Brief Admission History:  85 y.o.female,with history of Parkinson's disease, myocardial infarction, hypertension, hyperlipidemia, GERD, [name] presents to the ED with a chief complaint of fall. Patient is confused, waxing and waning confusion has been normal for her recently, so daughter provides most of history. Daughter reports that patient has been having vagal syncopal episodes. The last one was at the beginning of March. Today patient hit her life alert button and when daughter got over there she was diaphoretic, and altered from baseline. Daughter describes her as groggy, slumped over, mumbling incoherently. Blood pressure was 110/78, but when they tried to stand her up it dropped. Daughter reports it is normal for her to have these episodes after syncope where she is altered from baseline. About an hour after her syncopal episode she was closer to her baseline mentation so family try to stand her up. She complained that her right leg. She could not weight-bear. Is normal for her to have cramps in her legs but she is usually able to bear weight. The decision was made to get patient to the ER. Of note patient has no history of seizures. Patient herself does not remember much of the event. She reports that she was walking out of the bathroom with a cup of tea-which is abnormal and she is unable to explain-without her walker when she lost her  balance and fell. She thinks she hit her head. She is not sure if she lost consciousness. She not aware of any preceding symptoms, but overall is a difficult historian due to her confusion. She was started on a new medication 3 weeks ago-Zoloft. Her last syncopal episode was before the Zoloft was started. Patient is a former smoker, does not drink alcohol, does not use illicit drugs. She is vaccinated for COVID. Patient is DNR.  In the ED Temp 97.8, heart rate 64, respiratory rate 18, blood pressure 151/82, satting at 94% Leukocytosis with white blood cell count 13.2, hemoglobin 13.0 Chemistry panel shows a creatinine of 1.64 CT head shows advanced senescent changes similar to that seen on prior exam with no acute abnormality or fracture  EKG shows a heart rate of 68, sinus rhythm, QTC 426 Bilateral superior and inferior pubic rami fractures with mild displacement on right as well as lower lumbar spondylosis shown on x-ray Right femur is unremarkable with right hip and knee well aligned Patient was adamant that she wanted to go home, but was not able to ambulate in the ER so admission requested for pain control and PT evaluation  Assessment & Plan:   Principal Problem:   Bilateral fracture of pubic rami    Coronary atherosclerosis   Benign hypertensive heart disease without heart failure   Bilateral dry eyes   HLD (hyperlipidemia)   GERD (gastroesophageal reflux disease)   Syncope and collapse   Chronic pain   At high risk for falls   Orthostatic hypotension   Acute kidney injury    Bilateral pubic rami fracture - discussed with orthopedics, conservative  management, pain control and physical therapy recommended. Goal at this time is pain control and ambulation.  PT eval requested and they are recommending SNF placement.  Ambulatory referral to orthopedics made for outpatient follow up.    Orthostatic hypotension and recurrent syncope - Pt was clinically dehydrated and has  responded well to gentle IV fluid hydration. Fall precautions recommended.  Possibly exacerbated by Mild to moderate aortic stenosis seen on 2D Echocardiogram.  Pt will need SNF level care after discharge from hospital.  Carotid dopplers - no hemodynamically significant stenosis.   Echocardiogram:  Left ventricular ejection fraction, by estimation, is 70 to 75%. The left ventricle has hyperdynamic function. The left ventricle has no regional wall motion abnormalities. There is moderate left ventricular hypertrophy. Left ventricular diastolic parameters are consistent with Grade I diastolic dysfunction (impaired relaxation).  2. Right ventricular systolic function is mildly reduced. The right ventricular size is normal. Mildly increased right ventricular wall thickness.  3. The mitral valve is normal in structure. Trivial mitral valve regurgitation.  4. Tricuspid valve regurgitation is mild to moderate.  5. AV is thickened, calcified with mildly restricted motion Peak and mean gradients through the valve are 14 and 7 mm Hg respectively. The aortic valve is tricuspid. Aortic valve regurgitation is not visualized. Mild to moderate aortic valve sclerosis/calcification is present, without any evidence of aortic stenosis.  6. The inferior vena cava is normal in size with greater than 50% respiratory variability, suggesting right atrial pressure of 3 mmHg. FINDINGS  Left Ventricle: Left ventricular ejection fraction, by estimation, is 70 to 75%. The left ventricle has hyperdynamic function. The left ventricle has no regional wall motion abnormalities. The left ventricular internal cavity size was normal in size. There is moderate left ventricular hypertrophy. Left ventricular diastolic parameters are consistent with Grade I diastolic dysfunction (impaired relaxation). .   Hypokalemia - this has been repleted orally.  Hypomagnesemia - This has been replaced.   AKI - prerenal - resolved after IV fluids.    Leukocytosis - reactive from fall and acute fracture - this has resolved.   Depression - resumed home sertraline.    GERD - ordered protonix for GI protection in hospital resume home omeprazole at discharge.    Chronic pain - better controlled on current regimen. Added peri-colace daily for opioid induced constipation.  Continue scheduled tylenol, tramadol ordered as needed.  Oxycodone caused too much confusion and agitation so it will be discontinued.     Essential hypertension - blood pressures elevated, started amlodipine 5 mg daily, titrate outpatient as needed for BP control.    DVT prophylaxis: SQ heparin Code Status: full  Family Communication: daughter bedside Disposition: SNF placement   Discharge Diagnoses:  Principal Problem:   Bilateral fracture of pubic rami (HCC) Active Problems:   Coronary atherosclerosis   Benign hypertensive heart disease without heart failure   Bilateral dry eyes   HLD (hyperlipidemia)   GERD (gastroesophageal reflux disease)   Syncope and collapse   Chronic pain   At high risk for falls   Orthostatic hypotension   Acute kidney injury [facility])   Discharge Instructions: Discharge Instructions    AMB referral to orthopedics   Complete by: As directed    Amb Referral to Palliative Care   Complete by: As directed      Allergies as of 05/21/2020      Reactions   Zetia [ezetimibe]    Swelling   Sulfa Antibiotics Rash      Medication List  STOP taking these medications   Biotin 1 MG Caps   calcium-vitamin D 500-200 MG-UNIT tablet Commonly known as: OSCAL WITH D   Cranberry 4650 MG Caps   Garlic 10 MG Caps   TH VITAMIN E PO   Zinc 50 MG Caps     TAKE these medications   acetaminophen 325 MG tablet Commonly known as: TYLENOL Take 2 tablets (650 mg total) by mouth every 6 (six) hours.   amLODipine 5 MG tablet Commonly known as: NORVASC Take 1 tablet (5 mg total) by mouth daily. Start taking on: May 22, 2020    aspirin 81 MG tablet Take 81 mg by mouth daily.   calcium carbonate 1500 (600 Ca) MG Tabs tablet Commonly known as: OSCAL Take 1,500 mg by mouth daily with breakfast.   carbidopa-levodopa 50-200 MG tablet Commonly known as: SINEMET CR TAKE ONE TABLET DAILY AT BEDTIME What changed: Another medication with the same name was changed. Make sure you understand how and when to take each.   carbidopa-levodopa 25-100 MG tablet Commonly known as: SINEMET IR Take 2 tablets by mouth 3 (three) times daily. What changed: See the new instructions.   Fish Oil 1000 MG Caps Take 1 capsule by mouth 3 (three) times daily.   omeprazole 40 MG capsule Commonly known as: PRILOSEC Take 40 mg by mouth daily.   pravastatin 80 MG tablet Commonly known as: PRAVACHOL Take 80 mg by mouth daily.   senna-docusate 8.6-50 MG tablet Commonly known as: Senokot-S Take 2 tablets by mouth at bedtime as needed for mild constipation.   sertraline 50 MG tablet Commonly known as: ZOLOFT Take 50 mg by mouth at bedtime.   traMADol 50 MG tablet Commonly known as: Ultram Take 1 tablet (50 mg total) by mouth every 6 (six) hours as needed for up to 3 days for severe pain.   vitamin C 500 MG tablet Commonly known as: ASCORBIC ACID Take 500 mg by mouth daily.       Contact information for after-discharge care    Destination    [facility] .   Service: Skilled Nursing Contact information: [address] [place] [phone]                 Allergies  Allergen Reactions  . Zetia [Ezetimibe]     Swelling   . Sulfa Antibiotics Rash   Allergies as of 05/21/2020      Reactions   Zetia [ezetimibe]    Swelling   Sulfa Antibiotics Rash      Medication List    STOP taking these medications   Biotin 1 MG Caps   calcium-vitamin D 500-200 MG-UNIT tablet Commonly known as: OSCAL WITH D   Cranberry 5170 MG Caps    Garlic 10 MG Caps   TH VITAMIN E PO   Zinc 50 MG Caps     TAKE these medications   acetaminophen 325 MG tablet Commonly known as: TYLENOL Take 2 tablets (650 mg total) by mouth every 6 (six) hours.   amLODipine 5 MG tablet Commonly known as: NORVASC Take 1 tablet (5 mg total) by mouth daily. Start taking on: May 22, 2020   aspirin 81 MG tablet Take 81 mg by mouth daily.   calcium carbonate 1500 (600 Ca) MG Tabs tablet Commonly known as: OSCAL Take 1,500 mg by mouth daily with breakfast.   carbidopa-levodopa 50-200 MG tablet Commonly known as: SINEMET CR TAKE ONE TABLET  DAILY AT BEDTIME What changed: Another medication with the same name was changed. Make sure you understand how and when to take each.   carbidopa-levodopa 25-100 MG tablet Commonly known as: SINEMET IR Take 2 tablets by mouth 3 (three) times daily. What changed: See the new instructions.   Fish Oil 1000 MG Caps Take 1 capsule by mouth 3 (three) times daily.   omeprazole 40 MG capsule Commonly known as: PRILOSEC Take 40 mg by mouth daily.   pravastatin 80 MG tablet Commonly known as: PRAVACHOL Take 80 mg by mouth daily.   senna-docusate 8.6-50 MG tablet Commonly known as: Senokot-S Take 2 tablets by mouth at bedtime as needed for mild constipation.   sertraline 50 MG tablet Commonly known as: ZOLOFT Take 50 mg by mouth at bedtime.   traMADol 50 MG tablet Commonly known as: Ultram Take 1 tablet (50 mg total) by mouth every 6 (six) hours as needed for up to 3 days for severe pain.   vitamin C 500 MG tablet Commonly known as: ASCORBIC ACID Take 500 mg by mouth daily.       Procedures/Studies: CT Head Wo Contrast  Result Date: 05/16/2020 CLINICAL DATA:  Fall, head injury, loss of consciousness EXAM: CT HEAD WITHOUT CONTRAST TECHNIQUE: Contiguous axial images were obtained from the base of the skull through the vertex without intravenous contrast. COMPARISON:  MRI 11/09/2013 FINDINGS:  Brain: Normal anatomic configuration. Relatively advanced diffuse parenchymal volume loss is again noted. Extensive subcortical and periventricular white matter changes are present likely reflecting the sequela of small vessel ischemia. No abnormal intra or extra-axial mass lesion or fluid collection. No abnormal mass effect or midline shift. No evidence of acute intracranial hemorrhage or infarct. Borderline ventriculomegaly likely reflects ex vacuo dilation secondary to central atrophy. Cerebellum unremarkable. Vascular: No asymmetric hyperdense vasculature at the skull base. Skull: Intact Sinuses/Orbits: Paranasal sinuses are clear. Orbits are unremarkable. Other: Mastoid air cells and middle ear cavities are clear. IMPRESSION: No acute intracranial abnormality.  No calvarial fracture. Advanced senescent change, similar to that seen on prior examination. Electronically Signed   By: Fidela Salisbury MD   On: 05/16/2020 22:44   US Carotid Bilateral  Result Date: 05/17/2020 CLINICAL DATA:  Syncope with collapse, hyperlipidemia EXAM: BILATERAL CAROTID DUPLEX ULTRASOUND TECHNIQUE: Pearline Cables scale imaging, color Doppler and duplex ultrasound were performed of bilateral carotid and vertebral arteries in the neck. COMPARISON:  None. FINDINGS: Criteria: Quantification of carotid stenosis is based on velocity parameters that correlate the residual internal carotid diameter with NASCET-based stenosis levels, using the diameter of the distal internal carotid lumen as the denominator for stenosis measurement. The following velocity measurements were obtained: RIGHT ICA: 48/13 cm/sec CCA: 21/30 cm/sec SYSTOLIC ICA/CCA RATIO:  1.0 ECA: 72 cm/sec LEFT ICA: 38/8 cm/sec CCA: 86/57 cm/sec SYSTOLIC ICA/CCA RATIO:  0.6 ECA: 59 cm/sec RIGHT CAROTID ARTERY: Minor echogenic shadowing plaque formation. No hemodynamically significant right ICA stenosis, velocity elevation, or turbulent flow. Degree of narrowing less than 50%. RIGHT VERTEBRAL  ARTERY:  Normal antegrade flow LEFT CAROTID ARTERY: Similar scattered minor echogenic plaque formation. No hemodynamically significant left ICA stenosis, velocity elevation, or turbulent flow. LEFT VERTEBRAL ARTERY:  Normal antegrade flow IMPRESSION: Minor carotid atherosclerosis. No hemodynamically significant ICA stenosis. Degree of narrowing less than 50% bilaterally by ultrasound criteria. Patent antegrade vertebral flow bilaterally Electronically Signed   By: Jerilynn Mages.  Shick M.D.   On: 05/17/2020 10:18   EEG adult  Result Date: 05/17/2020 Lora Havens, MD  05/17/2020  5:30 PM Patient Name: DARENE NAPPI MRN: [id number] Epilepsy Attending: Lora Havens Referring Physician/Provider: Dr Irwin Brakeman Date: 05/17/2020 Duration: 23.30 mins Patient history: 85yo F with syncope. EEG to evaluate for seizure Level of alertness: Awake AEDs during EEG study: None Technical aspects: This EEG study was done with scalp electrodes positioned according to the 10-20 International system of electrode placement. Electrical activity was acquired at a sampling rate of 500Hz  and reviewed with a high frequency filter of 70Hz  and a low frequency filter of 1Hz . EEG data were recorded continuously and digitally stored. Description: The posterior dominant rhythm consists of 7.5-8 Hz activity of moderate voltage (25-35 uV) seen predominantly in posterior head regions, symmetric and reactive to eye opening and eye closing. Continuous generalized 5-6hz  theta slowing was also noted. Hyperventilation and photic stimulation were not performed.   ABNORMALITY - Continuous slow, generalized IMPRESSION: This study is suggestive of mild diffuse encephalopathy, nonspecific etiology. No seizures or epileptiform discharges were seen throughout the recording. Lora Havens   ECHOCARDIOGRAM COMPLETE  Result Date: 05/18/2020    ECHOCARDIOGRAM REPORT   Patient Name:   ANERI SLAGEL Date of Exam: 05/18/2020 Medical Rec #:  [id number]          Height:       63.0 in Accession #:    [id number]        Weight:       144.0 lb Date of Birth:  Feb 02, 1936         BSA:          1.681 m Patient Age:    85 years          BP:           165/88 mmHg Patient Gender: F                 HR:           70 bpm. Exam Location:  [facility] Procedure: 2D Echo Indications:    Syncope R55  History:        Patient has no prior history of Echocardiogram examinations. CAD                 and Previous Myocardial Infarction, Signs/Symptoms:Syncope; Risk                 Factors:Former Smoker, Hypertension and Dyslipidemia. GERD.  Sonographer:    Leavy Cella RDCS (AE) Referring Phys: [id number] ASIA B [place]  1. Left ventricular ejection fraction, by estimation, is 70 to 75%. The left ventricle has hyperdynamic function. The left ventricle has no regional wall motion abnormalities. There is moderate left ventricular hypertrophy. Left ventricular diastolic parameters are consistent with Grade I diastolic dysfunction (impaired relaxation).  2. Right ventricular systolic function is mildly reduced. The right ventricular size is normal. Mildly increased right ventricular wall thickness.  3. The mitral valve is normal in structure. Trivial mitral valve regurgitation.  4. Tricuspid valve regurgitation is mild to moderate.  5. AV is thickened, calcified with mildly restricted motion Peak and mean gradients through the valve are 14 and 7 mm Hg respectively. The aortic valve is tricuspid. Aortic valve regurgitation is not visualized. Mild to moderate aortic valve sclerosis/calcification is present, without any evidence of aortic stenosis.  6. The inferior vena cava is normal in size with greater than 50% respiratory variability, suggesting right atrial pressure of 3 mmHg. FINDINGS  Left Ventricle: Left ventricular ejection fraction, by estimation, is 70 to 75%.  The left ventricle has hyperdynamic function. The left ventricle has no regional wall motion abnormalities.  The left ventricular internal cavity size was normal in size. There is moderate left ventricular hypertrophy. Left ventricular diastolic parameters are consistent with Grade I diastolic dysfunction (impaired relaxation). Right Ventricle: The right ventricular size is normal. Mildly increased right ventricular wall thickness. Right ventricular systolic function is mildly reduced. Left Atrium: Left atrial size was normal in size. Right Atrium: Right atrial size was normal in size. Pericardium: There is no evidence of pericardial effusion. Mitral Valve: The mitral valve is normal in structure. Trivial mitral valve regurgitation. Tricuspid Valve: The tricuspid valve is normal in structure. Tricuspid valve regurgitation is mild to moderate. Aortic Valve: AV is thickened, calcified with mildly restricted motion Peak and mean gradients through the valve are 14 and 7 mm Hg respectively. The aortic valve is tricuspid. Aortic valve regurgitation is not visualized. Mild to moderate aortic valve sclerosis/calcification is present, without any evidence of aortic stenosis. Pulmonic Valve: The pulmonic valve was not well visualized. Pulmonic valve regurgitation is not visualized. Aorta: The aortic root is normal in size and structure. Venous: The inferior vena cava is normal in size with greater than 50% respiratory variability, suggesting right atrial pressure of 3 mmHg. IAS/Shunts: No atrial level shunt detected by color flow Doppler.  LEFT VENTRICLE PLAX 2D LVIDd:         3.52 cm  Diastology LVIDs:         1.93 cm  LV e' medial:    5.55 cm/s LV PW:         1.63 cm  LV E/e' medial:  11.9 LV IVS:        1.44 cm  LV e' lateral:   4.90 cm/s LVOT diam:     2.00 cm  LV E/e' lateral: 13.5 LVOT Area:     3.14 cm  RIGHT VENTRICLE RV S prime:     22.10 cm/s TAPSE (M-mode): 2.5 cm LEFT ATRIUM             Index       RIGHT ATRIUM           Index LA diam:        2.50 cm 1.49 cm/m  RA Area:     13.10 cm LA Vol (A2C):   40.2 ml 23.91 ml/m  RA Volume:   27.60 ml  16.41 ml/m LA Vol (A4C):   41.1 ml 24.44 ml/m LA Biplane Vol: 41.9 ml 24.92 ml/m   AORTA Ao Root diam: 2.50 cm MITRAL VALVE                TRICUSPID VALVE MV Area (PHT): 2.83 cm     TR Peak grad:   55.7 mmHg MV Decel Time: 268 msec     TR Vmax:        373.00 cm/s MV E velocity: 66.00 cm/s MV A velocity: 102.00 cm/s  SHUNTS MV E/A ratio:  0.65         Systemic Diam: 2.00 cm Dorris Carnes MD Electronically signed by Dorris Carnes MD Signature Date/Time: 05/18/2020/4:50:09 PM    Final    DG Femur Min 2 Views Right  Result Date: 05/16/2020 CLINICAL DATA:  Weakness, fell, right hip pain EXAM: RIGHT FEMUR 2 VIEWS; DG HIP (WITH OR WITHOUT PELVIS) 3-4V BILAT COMPARISON:  None. FINDINGS: Bilateral hips: Frontal view of the pelvis as well as frontal and frogleg lateral views of the bilateral hips are obtained. There are  comminuted minimally displaced fractures through the bilateral superior and inferior pubic rami, with mild displacement on the right. No other acute bony abnormalities. Hips are well aligned. Mild symmetrical joint space narrowing. Prominent lower lumbar spondylosis. Right femur: Frontal and lateral views demonstrate an unremarkable right femur. Right hip and knee are well aligned. Soft tissues are unremarkable. IMPRESSION: 1. Bilateral superior and inferior pubic rami fractures, with mild displacement on the right. 2. No other acute bony abnormalities. 3. Lower lumbar spondylosis. 4. Mild symmetrical hip osteoarthritis. Electronically Signed   By: Randa Ngo M.D.   On: 05/16/2020 22:34   DG Hips Bilat W or Wo Pelvis 3-4 Views  Result Date: 05/16/2020 CLINICAL DATA:  Weakness, fell, right hip pain EXAM: RIGHT FEMUR 2 VIEWS; DG HIP (WITH OR WITHOUT PELVIS) 3-4V BILAT COMPARISON:  None. FINDINGS: Bilateral hips: Frontal view of the pelvis as well as frontal and frogleg lateral views of the bilateral hips are obtained. There are comminuted minimally displaced fractures through the  bilateral superior and inferior pubic rami, with mild displacement on the right. No other acute bony abnormalities. Hips are well aligned. Mild symmetrical joint space narrowing. Prominent lower lumbar spondylosis. Right femur: Frontal and lateral views demonstrate an unremarkable right femur. Right hip and knee are well aligned. Soft tissues are unremarkable. IMPRESSION: 1. Bilateral superior and inferior pubic rami fractures, with mild displacement on the right. 2. No other acute bony abnormalities. 3. Lower lumbar spondylosis. 4. Mild symmetrical hip osteoarthritis. Electronically Signed   By: Randa Ngo M.D.   On: 05/16/2020 22:34     Subjective: Pt sitting up in chair, feels better, pain controlled, hurts mostly when ambulating.  No complaints. Agreeable to SNF rehab therapy.   Discharge Exam: Vitals:   05/21/20 0852 05/21/20 0852  BP: (!) 168/91 (!) 168/91  Pulse:  86  Resp:  16  Temp:  98.5 F (36.9 C)  SpO2:  93%   Vitals:   05/21/20 0621 05/21/20 0809 05/21/20 0852 05/21/20 0852  BP: (!) 157/90 138/78 (!) 168/91 (!) 168/91  Pulse: 67 89  86  Resp: 16 16  16   Temp: 99.2 F (37.3 C) (!) 97.5 F (36.4 C)  98.5 F (36.9 C)  TempSrc: Oral Oral  Oral  SpO2: 92% 98%  93%  Weight:      Height:       General exam: frail, elderly female lying supine in bed, alert, oriented to person, place, Appears calm and mildly uncomfortable.  Respiratory system: Clear to auscultation. Respiratory effort normal. Cardiovascular system: normal S1 & S2 heard. No JVD, murmurs, rubs, gallops or clicks. No pedal edema. Gastrointestinal system: Abdomen is nondistended, soft and nontender. No organomegaly or masses felt. Normal bowel sounds heard. Central nervous system: Alert and oriented. No focal neurological deficits. Extremities: improving bilateral hip pain, diminished mobility due to pain. Skin: No rashes, lesions or ulcers Psychiatry: Judgement and insight appear poor. Mood & affect  appropriate.    The results of significant diagnostics from this hospitalization (including imaging, microbiology, ancillary and laboratory) are listed below for reference.     Microbiology: Recent Results (from the past 240 hour(s))  SARS CORONAVIRUS 2 (TAT 6-24 HRS) Nasopharyngeal Nasopharyngeal Swab     Status: None   Collection Time: 05/16/20 11:02 PM   Specimen: Nasopharyngeal Swab  Result Value Ref Range Status   SARS Coronavirus 2 NEGATIVE NEGATIVE Final    Comment: (NOTE) SARS-CoV-2 target nucleic acids are NOT DETECTED.  The SARS-CoV-2 RNA is generally detectable  in upper and lower respiratory specimens during the acute phase of infection. Negative results do not preclude SARS-CoV-2 infection, do not rule out co-infections with other pathogens, and should not be used as the sole basis for treatment or other patient management decisions. Negative results must be combined with clinical observations, patient history, and epidemiological information. The expected result is Negative.  Fact Sheet for Patients: SugarRoll.be  Fact Sheet for Healthcare Providers: https://www.woods-mathews.com/  This test is not yet approved or cleared by the [place] FDA and  has been authorized for detection and/or diagnosis of SARS-CoV-2 by FDA under an Emergency Use Authorization (EUA). This EUA will remain  in effect (meaning this test can be used) for the duration of the COVID-19 declaration under Se ction 564(b)(1) of the Act, 21 U.S.C. section 360bbb-3(b)(1), unless the authorization is terminated or revoked sooner.  Performed at [facility], [place] [address] [place], [place] [address]    Labs: BNP (last 3 results) No results for input(s): BNP in the last 8760 hours. Basic Metabolic Panel: Recent Labs  Lab 05/16/20 2200 05/17/20 0437 05/18/20 0441 05/19/20 0504  NA 136 138 137 137  K 3.4* 3.7 3.2* 3.5  CL 99 102 104 104   CO2 25 23 23 25   GLUCOSE 126* 111* 90 102*  BUN 26* 28* 23 20  CREATININE 1.64* 1.33* 0.97 0.93  CALCIUM 9.1 9.1 8.4* 8.5*  MG  --  1.9  --   --    Liver Function Tests: Recent Labs  Lab 05/16/20 2200 05/17/20 0437  AST 23 19  ALT 6 7  ALKPHOS 96 91  BILITOT 0.7 0.8  PROT 7.9 6.9  ALBUMIN 3.9 3.5   No results for input(s): LIPASE, AMYLASE in the last 168 hours. No results for input(s): AMMONIA in the last 168 hours. CBC: Recent Labs  Lab 05/16/20 2200 05/17/20 0437 05/18/20 0441  WBC 13.2* 11.2* 8.5  NEUTROABS  --  9.1*  --   HGB 13.0 12.0 10.7*  HCT 41.1 38.6 33.6*  MCV 90.9 91.0 90.1  PLT 225 188 166   Cardiac Enzymes: No results for input(s): CKTOTAL, CKMB, CKMBINDEX, TROPONINI in the last 168 hours. BNP: Invalid input(s): POCBNP CBG: No results for input(s): GLUCAP in the last 168 hours. D-Dimer No results for input(s): DDIMER in the last 72 hours. Hgb A1c No results for input(s): HGBA1C in the last 72 hours. Lipid Profile No results for input(s): CHOL, HDL, LDLCALC, TRIG, CHOLHDL, LDLDIRECT in the last 72 hours. Thyroid function studies No results for input(s): TSH, T4TOTAL, T3FREE, THYROIDAB in the last 72 hours.  Invalid input(s): FREET3 Anemia work up No results for input(s): VITAMINB12, FOLATE, FERRITIN, TIBC, IRON, RETICCTPCT in the last 72 hours. Urinalysis No results found for: COLORURINE, APPEARANCEUR, [place], [place], [place], [place], [place], [place], PROTEINUR, UROBILINOGEN, NITRITE, LEUKOCYTESUR Sepsis Labs Invalid input(s): PROCALCITONIN,  WBC,  LACTICIDVEN Microbiology Recent Results (from the past 240 hour(s))  SARS CORONAVIRUS 2 (TAT 6-24 HRS) Nasopharyngeal Nasopharyngeal Swab     Status: None   Collection Time: 05/16/20 11:02 PM   Specimen: Nasopharyngeal Swab  Result Value Ref Range Status   SARS Coronavirus 2 NEGATIVE NEGATIVE Final    Comment: (NOTE) SARS-CoV-2 target nucleic acids are NOT DETECTED.  The SARS-CoV-2  RNA is generally detectable in upper and lower respiratory specimens during the acute phase of infection. Negative results do not preclude SARS-CoV-2 infection, do not rule out co-infections with other pathogens, and should not be used as the sole  basis for treatment or other patient management decisions. Negative results must be combined with clinical observations, patient history, and epidemiological information. The expected result is Negative.  Fact Sheet for Patients: SugarRoll.be  Fact Sheet for Healthcare Providers: https://www.woods-mathews.com/  This test is not yet approved or cleared by the [place] FDA and  has been authorized for detection and/or diagnosis of SARS-CoV-2 by FDA under an Emergency Use Authorization (EUA). This EUA will remain  in effect (meaning this test can be used) for the duration of the COVID-19 declaration under Se ction 564(b)(1) of the Act, 21 U.S.C. section 360bbb-3(b)(1), unless the authorization is terminated or revoked sooner.  Performed at [facility], [place] [address], [place], [place] [address]    Time coordinating discharge: 38 mins   SIGNED:  Irwin Brakeman, MD  [facility] 05/21/2020, 12:07 PM How to contact the [facility] Attending or Consulting provider [place] or covering provider during after hours [place], for this patient?  1. Check the care team in [facility] and look for a) attending/consulting TRH provider listed and b) the [facility] team listed 2. Log into [email or web address] and use [facility]'s universal password to access. If you do not have the password, please contact the hospital operator. 3. Locate the [facility] provider you are looking for under [facility] and page to a number that you can be directly reached. 4. If you still have difficulty reaching the provider, please page the [facility] (Director on Call) for the Hospitalists listed on amion for assistance.

## 2020-05-21 NOTE — Progress Notes (Signed)
Physical Therapy Treatment Patient Details Name: Jacqueline Flores MRN: [id number] DOB: [birth year] Today's Date: 05/21/2020    History of Present Illness Jacqueline Flores  is a 85 y.o. female, with history of Parkinson's disease, myocardial infarction, hypertension, hyperlipidemia, GERD, Jacqueline Flores presents to the ED with a chief complaint of fall.  Patient is confused, waxing and waning confusion has been normal for her recently, so daughter provides most of history.  Daughter reports that patient has been having vagal syncopal episodes.  The last one was at the beginning of March.  Today patient hit her life alert button and when daughter got over there she was diaphoretic, and altered from baseline.  Daughter describes her as groggy, slumped over, mumbling incoherently.  Blood pressure was 110/78, but when they tried to stand her up it dropped.  Daughter reports it is normal for her to have these episodes after syncope where she is altered from baseline.  About an hour after her syncopal episode she was closer to her baseline mentation so family try to stand her up.  She complained that her right leg.  She could not weight-bear.  Is normal for her to have cramps in her legs but she is usually able to bear weight.  The decision was made to get patient to the ER.  Of note patient has no history of seizures.  Patient herself does not remember much of the event.  She reports that she was walking out of the bathroom with a cup of tea- which is abnormal and she is unable to explain-without her walker when she lost her balance and fell.  She thinks she hit her head.  She is not sure if she lost consciousness.  She not aware of any preceding symptoms, but overall is a difficult historian due to her confusion.  She was started on a new medication 3 weeks ago-Zoloft.  Her last syncopal episode was before the Zoloft was started.  Patient is a former smoker, does not drink alcohol, does not use illicit drugs.  She is  vaccinated for COVID.  Patient is DNR.    PT Comments    Patient improved in her ability to bear weight during transfers and ambulation with RW today. Patient does still bear quite a bit of weight through her UEs but required less external assist to weight bear through lower extremities. With cuing patient was able to boost herself up in bed with min assist to stabilize her feet in hook-lying position and her use of UEs on bedrails. With cuing, patient was able to perform a small bridge to allow for donning of her pants and under pants with PT and daughter assist. Overall, patient's mobility is improving.     Follow Up Recommendations  SNF     Equipment Recommendations  3in1 (PT)    Recommendations for Other Services       Precautions / Restrictions Precautions Precautions: Fall Restrictions Weight Bearing Restrictions: Yes RLE Weight Bearing: Weight bearing as tolerated LLE Weight Bearing: Weight bearing as tolerated    Mobility  Bed Mobility Overal bed mobility: Needs Assistance Bed Mobility: Sit to Supine     Supine to sit: Max assist;HOB elevated     General bed mobility comments: assist for LEs and trunk    Transfers Overall transfer level: Needs assistance Equipment used: Rolling walker (2 wheeled) Transfers: Sit to/from [facility] Sit to Stand: Mod assist;Min assist Stand pivot transfers: Min assist       General transfer comment: sit  to stand - cues for sequencing of steps and placement of hands; stand pivot transfer - assist to advance RW; slow, labored movement  Ambulation/Gait Ambulation/Gait assistance: Min assist Gait Distance (Feet): 4 Feet Assistive device: Rolling walker (2 wheeled) Gait Pattern/deviations: Step-to pattern;Decreased step length - right;Decreased step length - left;Decreased stride length;Decreased stance time - left;Trunk flexed;Leaning posteriorly;Decreased weight shift to left Gait velocity: decreased   General  Gait Details: slow, labored, unsteady gait with RW; patient bearing most of her own weight, heavy use of UEs; assist for balance and advancement of RW, limited to short distance due to pain, on room air   Stairs      Wheelchair Mobility    Modified Rankin (Stroke Patients Only)       Balance Overall balance assessment: Needs assistance Sitting-balance support: Feet supported;Bilateral upper extremity supported Sitting balance-Leahy Scale: Fair Sitting balance - Comments: fair/good seated in recliner   Standing balance support: During functional activity;Bilateral upper extremity supported Standing balance-Leahy Scale: Poor Standing balance comment: fair/poor using RW        Cognition Arousal/Alertness: Awake/alert Behavior During Therapy: WFL for tasks assessed/performed Overall Cognitive Status: Within Functional Limits for tasks assessed     Exercises      General Comments        Pertinent Vitals/Pain Pain Assessment: No/denies pain Pain Location: pain with movement, flat affect Pain Intervention(s): Limited activity within patient's tolerance;Monitored during session;Repositioned    Home Living          Prior Function            PT Goals (current goals can now be found in the care plan section) Acute Rehab PT Goals Patient Stated Goal: return home after rehab PT Goal Formulation: With patient/family Time For Goal Achievement: 05/31/20 Potential to Achieve Goals: Good Progress towards PT goals: Progressing toward goals    Frequency    Min 3X/week      PT Plan Current plan remains appropriate       AM-PAC PT "6 Clicks" Mobility   Outcome Measure  Help needed turning from your back to your side while in a flat bed without using bedrails?: A Lot Help needed moving from lying on your back to sitting on the side of a flat bed without using bedrails?: A Lot Help needed moving to and from a bed to a chair (including a wheelchair)?: A  Little Help needed standing up from a chair using your arms (e.g., wheelchair or bedside chair)?: A Lot Help needed to walk in hospital room?: A Lot Help needed climbing 3-5 steps with a railing? : Total 6 Click Score: 12    End of Session Equipment Utilized During Treatment: Gait belt Activity Tolerance: Patient tolerated treatment well;Patient limited by pain Patient left: with call bell/phone within reach;with family/visitor present;in bed Nurse Communication: Mobility status PT Visit Diagnosis: Unsteadiness on feet (R26.81);Other abnormalities of gait and mobility (R26.89);Muscle weakness (generalized) (M62.81)     Time: 8144-8185 PT Time Calculation (min) (ACUTE ONLY): 25 min  Charges:  $Therapeutic Activity: 23-37 mins                     Floria Raveling. Hartnett-Rands, MS, PT Per [place] [phone]  Pamala Hurry  Hartnett-Rands 05/21/2020, 12:51 PM

## 2020-05-21 NOTE — Discharge Instructions (Signed)

## 2020-05-21 NOTE — TOC Transition Note (Signed)
Transition of Care [facility]) - CM/SW Discharge Note   Patient Details  Name: Jacqueline Flores MRN: [id number] Date of Birth: 1935-06-19  Transition of Care [facility]) CM/SW Contact:  Boneta Lucks, RN Phone Number: 05/21/2020, 12:15 PM   Clinical Narrative:   Patient is medically ready for discharge. Mardene Celeste at [facility] has insurance auth. MD is recommending out patient palliative. Daughter is agreeable. TOC confirmed with Mardene Celeste at [facility] that Palliative can come in to meet with family. Referral made to [facility], sent in the hub and text to update.  Medical necessity printed, TOC will call EMS after RN calls report.    Final next level of care: Skilled Nursing Facility Barriers to Discharge: Barriers Resolved   Patient Goals and CMS Choice Patient states their goals for this hospitalization and ongoing recovery are:: to go to rehab. CMS Medicare.gov Compare Post Acute Care list provided to:: Patient Represenative (must comment) Choice offered to / list presented to : Adult Children  Discharge Placement              Patient chooses bed at: [facility]) Patient to be transferred to facility by: EMS Name of family member notified: Daughter at the beside Patient and family notified of of transfer: 05/21/20  Discharge Plan and Services In-house Referral: Clinical Social Work   Post Acute Care Choice: [place]               Readmission Risk Interventions Readmission Risk Prevention Plan 05/21/2020  Post Dischage Appt Complete  Medication Screening Complete  Transportation Screening Complete  Some recent data might be hidden

## 2020-05-21 NOTE — Care Management Important Message (Signed)
Important Message  Patient Details  Name: DESSIE TATEM MRN: [id number] Date of Birth: February 11, 1935   Medicare Important Message Given:  Yes     Natasha Bence, LCSW 05/21/2020, 12:09 PM

## 2020-05-22 DIAGNOSIS — S32502A Unspecified fracture of left pubis, initial encounter for closed fracture: Secondary | ICD-10-CM | POA: Diagnosis not present

## 2020-05-22 DIAGNOSIS — S32501A Unspecified fracture of right pubis, initial encounter for closed fracture: Secondary | ICD-10-CM | POA: Diagnosis not present

## 2020-05-22 DIAGNOSIS — I10 Essential (primary) hypertension: Secondary | ICD-10-CM | POA: Diagnosis not present

## 2020-05-22 DIAGNOSIS — E7849 Other hyperlipidemia: Secondary | ICD-10-CM | POA: Diagnosis not present

## 2020-05-22 DIAGNOSIS — I7090 Unspecified atherosclerosis: Secondary | ICD-10-CM | POA: Diagnosis not present

## 2020-05-28 DIAGNOSIS — M6281 Muscle weakness (generalized): Secondary | ICD-10-CM | POA: Diagnosis not present

## 2020-05-28 DIAGNOSIS — S32591A Other specified fracture of right pubis, initial encounter for closed fracture: Secondary | ICD-10-CM | POA: Diagnosis not present

## 2020-05-28 DIAGNOSIS — S32592A Other specified fracture of left pubis, initial encounter for closed fracture: Secondary | ICD-10-CM | POA: Diagnosis not present

## 2020-05-28 DIAGNOSIS — G20 Parkinson's disease: Secondary | ICD-10-CM | POA: Diagnosis not present

## 2020-06-01 ENCOUNTER — Ambulatory Visit: Payer: Medicare HMO | Admitting: Orthopedic Surgery

## 2020-06-01 ENCOUNTER — Ambulatory Visit (INDEPENDENT_AMBULATORY_CARE_PROVIDER_SITE_OTHER): Payer: Medicare HMO | Admitting: Orthopedic Surgery

## 2020-06-01 ENCOUNTER — Ambulatory Visit: Payer: Medicare HMO

## 2020-06-01 ENCOUNTER — Other Ambulatory Visit: Payer: Self-pay

## 2020-06-01 ENCOUNTER — Encounter: Payer: Self-pay | Admitting: Orthopedic Surgery

## 2020-06-01 VITALS — BP 125/72 | HR 83 | Ht 62.0 in

## 2020-06-01 DIAGNOSIS — W19XXXA Unspecified fall, initial encounter: Secondary | ICD-10-CM

## 2020-06-01 DIAGNOSIS — S32592A Other specified fracture of left pubis, initial encounter for closed fracture: Secondary | ICD-10-CM

## 2020-06-01 DIAGNOSIS — S32591A Other specified fracture of right pubis, initial encounter for closed fracture: Secondary | ICD-10-CM

## 2020-06-01 NOTE — Progress Notes (Signed)
New Patient Visit  Assessment: Jacqueline Flores is a 85 y.o. female with the following: 1. Bilateral pubic rami fractures, closed, initial encounter [facility])  Plan: Patient sustained multiple, bilateral pubic rami fractures approximately 2-3 weeks ago.  She remains in a facility.  Her pain is improving.  These fractures will not affect weightbearing.  She can continue with weightbearing as tolerated.  Anticipate that her pain will improve as the fracture is improved.  Medications for pain include Tylenol or low-level narcotic given her age and medical comorbidities.  This was discussed with her daughter over the phone, she is not present in the clinic today, and they will contact the clinic if they have any further concerns.   Follow-up: No follow-ups on file.  Subjective:  Chief Complaint  Patient presents with  . Hip Injury    Patient reports she has been falling a lot in the last 3 months due to her parkinson's.      History of Present Illness: Jacqueline Flores is a 85 y.o. female who presents for follow-up of bilateral pubic ramus fractures.  She sustained a mechanical fall approximately 2-3 weeks ago.  She was evaluated in the emergency department, subsequently admitted.  She recovered, and has been since been at a nursing facility.  Her pain is improving.  Pain is worse in her right leg.  She is able to ambulate with assistance.  Pain has been somewhat controlled with Tylenol as needed.   Review of Systems: No fevers or chills No numbness or tingling No chest pain No shortness of breath No bowel or bladder dysfunction No GI distress No headaches   Medical History:  Past Medical History:  Diagnosis Date  . Coronary artery disease    post MI  . Esophageal reflux   . Hematuria   . Hyperlipidemia   . Hypertension   . MI (myocardial infarction) ([place])    age 30  . Osteoarthritis   . Osteopenia     Past Surgical History:  Procedure Laterality Date  . CATARACT  EXTRACTION W/ INTRAOCULAR LENS  IMPLANT, BILATERAL    . DILATION AND CURETTAGE OF UTERUS    . lid lift     bilaterally, lid lag  . NEPHROLITHOTOMY      Family History  Problem Relation Age of Onset  . Breast cancer Mother   . Heart attack Father   . CAD Father   . Diabetes Maternal Grandmother    Social History   Tobacco Use  . Smoking status: Former Smoker    Years: 30.00    Quit date: 10/28/2010    Years since quitting: 9.6  . Smokeless tobacco: Never Used  Vaping Use  . Vaping Use: Never used  Substance Use Topics  . Alcohol use: Yes    Comment: glass of wine once a month  . Drug use: No    Allergies  Allergen Reactions  . Sulfa Antibiotics Rash  . Zetia [Ezetimibe] Other (See Comments)    Swelling     Current Meds  Medication Sig  . acetaminophen (TYLENOL) 325 MG tablet Take 2 tablets (650 mg total) by mouth every 6 (six) hours.  Marland Kitchen amLODipine (NORVASC) 5 MG tablet Take 1 tablet (5 mg total) by mouth daily.  Marland Kitchen aspirin 81 MG tablet Take 81 mg by mouth daily.  . calcium carbonate (OSCAL) 1500 (600 Ca) MG TABS tablet Take 1,500 mg by mouth daily with breakfast.   . carbidopa-levodopa (SINEMET CR) 50-200 MG tablet TAKE ONE TABLET  DAILY AT BEDTIME  . carbidopa-levodopa (SINEMET IR) 25-100 MG tablet Take 2 tablets by mouth 3 (three) times daily.  . Omega-3 Fatty Acids (FISH OIL) 1000 MG CAPS Take 1 capsule by mouth 3 (three) times daily.  Marland Kitchen omeprazole (PRILOSEC) 40 MG capsule Take 40 mg by mouth daily.  . pravastatin (PRAVACHOL) 80 MG tablet Take 80 mg by mouth daily.  Marland Kitchen senna-docusate (SENOKOT-S) 8.6-50 MG tablet Take 2 tablets by mouth at bedtime as needed for mild constipation.  . sertraline (ZOLOFT) 50 MG tablet Take 50 mg by mouth at bedtime.  . vitamin C (ASCORBIC ACID) 500 MG tablet Take 500 mg by mouth daily.    Objective: BP 125/72   Pulse 83   Ht 5\' 2"  (1.575 m)   BMI 26.21 kg/m   Physical Exam:  General: Elderly female.  No acute distress.  Seated  in wheelchair.  Alert and oriented. Gait: She does not ambulate without assistive device.  No deformity about the hips.  No specific points of tenderness.  She tolerates gentle range of motion bilateral hips.  No pain with axial loading.  Able to maintain a straight leg raise.  Sensation is intact distally.  She does have a tremor which was apparent in clinic today in her left hand.  IMAGING: I personally ordered and reviewed the following images   AP pelvis was obtained in clinic today and compared to previous x-rays.  This demonstrates minimally displaced fractures of the left superior and inferior pubic rami, as well as an inferior right pubic ramus fracture.  The superior right pubic ramus fracture remains in a stable position, but is more displaced compared to the other fractures.  There has been interval consolidation at the fracture sites.  Impression: Healing, stable bilateral superior and inferior pubic rami fractures.   New Medications:  No orders of the defined types were placed in this encounter.     Mordecai Rasmussen, MD  06/01/2020 10:52 PM

## 2020-06-05 ENCOUNTER — Ambulatory Visit: Payer: Medicare HMO | Admitting: Orthopedic Surgery

## 2020-06-08 DIAGNOSIS — M6281 Muscle weakness (generalized): Secondary | ICD-10-CM | POA: Diagnosis not present

## 2020-06-08 DIAGNOSIS — R2689 Other abnormalities of gait and mobility: Secondary | ICD-10-CM | POA: Diagnosis not present

## 2020-06-11 DIAGNOSIS — I2510 Atherosclerotic heart disease of native coronary artery without angina pectoris: Secondary | ICD-10-CM | POA: Diagnosis not present

## 2020-06-11 DIAGNOSIS — I252 Old myocardial infarction: Secondary | ICD-10-CM | POA: Diagnosis not present

## 2020-06-11 DIAGNOSIS — K219 Gastro-esophageal reflux disease without esophagitis: Secondary | ICD-10-CM | POA: Diagnosis not present

## 2020-06-11 DIAGNOSIS — M6281 Muscle weakness (generalized): Secondary | ICD-10-CM | POA: Diagnosis not present

## 2020-06-11 DIAGNOSIS — M8580 Other specified disorders of bone density and structure, unspecified site: Secondary | ICD-10-CM | POA: Diagnosis not present

## 2020-06-11 DIAGNOSIS — E7800 Pure hypercholesterolemia, unspecified: Secondary | ICD-10-CM | POA: Diagnosis not present

## 2020-06-11 DIAGNOSIS — S32591A Other specified fracture of right pubis, initial encounter for closed fracture: Secondary | ICD-10-CM | POA: Diagnosis not present

## 2020-06-11 DIAGNOSIS — G20 Parkinson's disease: Secondary | ICD-10-CM | POA: Diagnosis not present

## 2020-06-11 DIAGNOSIS — R69 Illness, unspecified: Secondary | ICD-10-CM | POA: Diagnosis not present

## 2020-06-11 DIAGNOSIS — M1990 Unspecified osteoarthritis, unspecified site: Secondary | ICD-10-CM | POA: Diagnosis not present

## 2020-06-11 DIAGNOSIS — S32592A Other specified fracture of left pubis, initial encounter for closed fracture: Secondary | ICD-10-CM | POA: Diagnosis not present

## 2020-06-11 DIAGNOSIS — I119 Hypertensive heart disease without heart failure: Secondary | ICD-10-CM | POA: Diagnosis not present

## 2020-06-13 DIAGNOSIS — I119 Hypertensive heart disease without heart failure: Secondary | ICD-10-CM | POA: Diagnosis not present

## 2020-06-13 DIAGNOSIS — M1990 Unspecified osteoarthritis, unspecified site: Secondary | ICD-10-CM | POA: Diagnosis not present

## 2020-06-13 DIAGNOSIS — G20 Parkinson's disease: Secondary | ICD-10-CM | POA: Diagnosis not present

## 2020-06-13 DIAGNOSIS — I2510 Atherosclerotic heart disease of native coronary artery without angina pectoris: Secondary | ICD-10-CM | POA: Diagnosis not present

## 2020-06-13 DIAGNOSIS — M8580 Other specified disorders of bone density and structure, unspecified site: Secondary | ICD-10-CM | POA: Diagnosis not present

## 2020-06-13 DIAGNOSIS — I252 Old myocardial infarction: Secondary | ICD-10-CM | POA: Diagnosis not present

## 2020-06-13 DIAGNOSIS — E7800 Pure hypercholesterolemia, unspecified: Secondary | ICD-10-CM | POA: Diagnosis not present

## 2020-06-13 DIAGNOSIS — R69 Illness, unspecified: Secondary | ICD-10-CM | POA: Diagnosis not present

## 2020-06-13 DIAGNOSIS — K219 Gastro-esophageal reflux disease without esophagitis: Secondary | ICD-10-CM | POA: Diagnosis not present

## 2020-06-18 DIAGNOSIS — G20 Parkinson's disease: Secondary | ICD-10-CM | POA: Diagnosis not present

## 2020-06-18 DIAGNOSIS — I2510 Atherosclerotic heart disease of native coronary artery without angina pectoris: Secondary | ICD-10-CM | POA: Diagnosis not present

## 2020-06-18 DIAGNOSIS — E7800 Pure hypercholesterolemia, unspecified: Secondary | ICD-10-CM | POA: Diagnosis not present

## 2020-06-18 DIAGNOSIS — M8580 Other specified disorders of bone density and structure, unspecified site: Secondary | ICD-10-CM | POA: Diagnosis not present

## 2020-06-18 DIAGNOSIS — I252 Old myocardial infarction: Secondary | ICD-10-CM | POA: Diagnosis not present

## 2020-06-18 DIAGNOSIS — I119 Hypertensive heart disease without heart failure: Secondary | ICD-10-CM | POA: Diagnosis not present

## 2020-06-18 DIAGNOSIS — M1990 Unspecified osteoarthritis, unspecified site: Secondary | ICD-10-CM | POA: Diagnosis not present

## 2020-06-18 DIAGNOSIS — K219 Gastro-esophageal reflux disease without esophagitis: Secondary | ICD-10-CM | POA: Diagnosis not present

## 2020-06-18 DIAGNOSIS — R69 Illness, unspecified: Secondary | ICD-10-CM | POA: Diagnosis not present

## 2020-06-19 DIAGNOSIS — K219 Gastro-esophageal reflux disease without esophagitis: Secondary | ICD-10-CM | POA: Diagnosis not present

## 2020-06-19 DIAGNOSIS — I119 Hypertensive heart disease without heart failure: Secondary | ICD-10-CM | POA: Diagnosis not present

## 2020-06-19 DIAGNOSIS — I252 Old myocardial infarction: Secondary | ICD-10-CM | POA: Diagnosis not present

## 2020-06-19 DIAGNOSIS — M8580 Other specified disorders of bone density and structure, unspecified site: Secondary | ICD-10-CM | POA: Diagnosis not present

## 2020-06-19 DIAGNOSIS — E7800 Pure hypercholesterolemia, unspecified: Secondary | ICD-10-CM | POA: Diagnosis not present

## 2020-06-19 DIAGNOSIS — M1990 Unspecified osteoarthritis, unspecified site: Secondary | ICD-10-CM | POA: Diagnosis not present

## 2020-06-19 DIAGNOSIS — I2510 Atherosclerotic heart disease of native coronary artery without angina pectoris: Secondary | ICD-10-CM | POA: Diagnosis not present

## 2020-06-19 DIAGNOSIS — G20 Parkinson's disease: Secondary | ICD-10-CM | POA: Diagnosis not present

## 2020-06-19 DIAGNOSIS — R69 Illness, unspecified: Secondary | ICD-10-CM | POA: Diagnosis not present

## 2020-06-20 DIAGNOSIS — M1990 Unspecified osteoarthritis, unspecified site: Secondary | ICD-10-CM | POA: Diagnosis not present

## 2020-06-20 DIAGNOSIS — E7800 Pure hypercholesterolemia, unspecified: Secondary | ICD-10-CM | POA: Diagnosis not present

## 2020-06-20 DIAGNOSIS — I2510 Atherosclerotic heart disease of native coronary artery without angina pectoris: Secondary | ICD-10-CM | POA: Diagnosis not present

## 2020-06-20 DIAGNOSIS — I252 Old myocardial infarction: Secondary | ICD-10-CM | POA: Diagnosis not present

## 2020-06-20 DIAGNOSIS — R69 Illness, unspecified: Secondary | ICD-10-CM | POA: Diagnosis not present

## 2020-06-20 DIAGNOSIS — M8580 Other specified disorders of bone density and structure, unspecified site: Secondary | ICD-10-CM | POA: Diagnosis not present

## 2020-06-20 DIAGNOSIS — K219 Gastro-esophageal reflux disease without esophagitis: Secondary | ICD-10-CM | POA: Diagnosis not present

## 2020-06-20 DIAGNOSIS — I119 Hypertensive heart disease without heart failure: Secondary | ICD-10-CM | POA: Diagnosis not present

## 2020-06-20 DIAGNOSIS — G20 Parkinson's disease: Secondary | ICD-10-CM | POA: Diagnosis not present

## 2020-06-21 DIAGNOSIS — R69 Illness, unspecified: Secondary | ICD-10-CM | POA: Diagnosis not present

## 2020-06-21 DIAGNOSIS — I119 Hypertensive heart disease without heart failure: Secondary | ICD-10-CM | POA: Diagnosis not present

## 2020-06-21 DIAGNOSIS — E7800 Pure hypercholesterolemia, unspecified: Secondary | ICD-10-CM | POA: Diagnosis not present

## 2020-06-21 DIAGNOSIS — I252 Old myocardial infarction: Secondary | ICD-10-CM | POA: Diagnosis not present

## 2020-06-21 DIAGNOSIS — M8580 Other specified disorders of bone density and structure, unspecified site: Secondary | ICD-10-CM | POA: Diagnosis not present

## 2020-06-21 DIAGNOSIS — K219 Gastro-esophageal reflux disease without esophagitis: Secondary | ICD-10-CM | POA: Diagnosis not present

## 2020-06-21 DIAGNOSIS — G20 Parkinson's disease: Secondary | ICD-10-CM | POA: Diagnosis not present

## 2020-06-21 DIAGNOSIS — I2510 Atherosclerotic heart disease of native coronary artery without angina pectoris: Secondary | ICD-10-CM | POA: Diagnosis not present

## 2020-06-21 DIAGNOSIS — M1990 Unspecified osteoarthritis, unspecified site: Secondary | ICD-10-CM | POA: Diagnosis not present

## 2020-06-25 DIAGNOSIS — G20 Parkinson's disease: Secondary | ICD-10-CM | POA: Diagnosis not present

## 2020-06-25 DIAGNOSIS — I252 Old myocardial infarction: Secondary | ICD-10-CM | POA: Diagnosis not present

## 2020-06-25 DIAGNOSIS — K219 Gastro-esophageal reflux disease without esophagitis: Secondary | ICD-10-CM | POA: Diagnosis not present

## 2020-06-25 DIAGNOSIS — I2510 Atherosclerotic heart disease of native coronary artery without angina pectoris: Secondary | ICD-10-CM | POA: Diagnosis not present

## 2020-06-25 DIAGNOSIS — M8580 Other specified disorders of bone density and structure, unspecified site: Secondary | ICD-10-CM | POA: Diagnosis not present

## 2020-06-25 DIAGNOSIS — R69 Illness, unspecified: Secondary | ICD-10-CM | POA: Diagnosis not present

## 2020-06-25 DIAGNOSIS — M1990 Unspecified osteoarthritis, unspecified site: Secondary | ICD-10-CM | POA: Diagnosis not present

## 2020-06-25 DIAGNOSIS — I119 Hypertensive heart disease without heart failure: Secondary | ICD-10-CM | POA: Diagnosis not present

## 2020-06-25 DIAGNOSIS — E7800 Pure hypercholesterolemia, unspecified: Secondary | ICD-10-CM | POA: Diagnosis not present

## 2020-06-26 DIAGNOSIS — G20 Parkinson's disease: Secondary | ICD-10-CM | POA: Diagnosis not present

## 2020-06-26 DIAGNOSIS — M8580 Other specified disorders of bone density and structure, unspecified site: Secondary | ICD-10-CM | POA: Diagnosis not present

## 2020-06-26 DIAGNOSIS — I119 Hypertensive heart disease without heart failure: Secondary | ICD-10-CM | POA: Diagnosis not present

## 2020-06-26 DIAGNOSIS — K219 Gastro-esophageal reflux disease without esophagitis: Secondary | ICD-10-CM | POA: Diagnosis not present

## 2020-06-26 DIAGNOSIS — E7800 Pure hypercholesterolemia, unspecified: Secondary | ICD-10-CM | POA: Diagnosis not present

## 2020-06-26 DIAGNOSIS — R69 Illness, unspecified: Secondary | ICD-10-CM | POA: Diagnosis not present

## 2020-06-26 DIAGNOSIS — I2510 Atherosclerotic heart disease of native coronary artery without angina pectoris: Secondary | ICD-10-CM | POA: Diagnosis not present

## 2020-06-26 DIAGNOSIS — M1990 Unspecified osteoarthritis, unspecified site: Secondary | ICD-10-CM | POA: Diagnosis not present

## 2020-06-26 DIAGNOSIS — I252 Old myocardial infarction: Secondary | ICD-10-CM | POA: Diagnosis not present

## 2020-06-27 ENCOUNTER — Observation Stay
Admission: AD | Admit: 2020-06-27 | Payer: Medicare HMO | Source: Other Acute Inpatient Hospital | Admitting: Family Medicine

## 2020-06-27 DIAGNOSIS — R0902 Hypoxemia: Secondary | ICD-10-CM | POA: Diagnosis not present

## 2020-06-27 DIAGNOSIS — E876 Hypokalemia: Secondary | ICD-10-CM | POA: Diagnosis not present

## 2020-06-27 DIAGNOSIS — R4182 Altered mental status, unspecified: Secondary | ICD-10-CM | POA: Diagnosis not present

## 2020-06-27 DIAGNOSIS — I517 Cardiomegaly: Secondary | ICD-10-CM | POA: Diagnosis not present

## 2020-06-27 DIAGNOSIS — Z20822 Contact with and (suspected) exposure to covid-19: Secondary | ICD-10-CM | POA: Diagnosis not present

## 2020-06-27 DIAGNOSIS — R7402 Elevation of levels of lactic acid dehydrogenase (LDH): Secondary | ICD-10-CM | POA: Diagnosis not present

## 2020-06-27 DIAGNOSIS — G9389 Other specified disorders of brain: Secondary | ICD-10-CM | POA: Diagnosis not present

## 2020-06-27 DIAGNOSIS — R930 Abnormal findings on diagnostic imaging of skull and head, not elsewhere classified: Secondary | ICD-10-CM | POA: Diagnosis not present

## 2020-06-27 DIAGNOSIS — D496 Neoplasm of unspecified behavior of brain: Secondary | ICD-10-CM | POA: Diagnosis not present

## 2020-06-27 DIAGNOSIS — J9811 Atelectasis: Secondary | ICD-10-CM | POA: Diagnosis not present

## 2020-06-27 DIAGNOSIS — Z743 Need for continuous supervision: Secondary | ICD-10-CM | POA: Diagnosis not present

## 2020-06-27 DIAGNOSIS — R569 Unspecified convulsions: Secondary | ICD-10-CM | POA: Diagnosis not present

## 2020-06-27 DIAGNOSIS — R404 Transient alteration of awareness: Secondary | ICD-10-CM | POA: Diagnosis not present

## 2020-06-27 DIAGNOSIS — G319 Degenerative disease of nervous system, unspecified: Secondary | ICD-10-CM | POA: Diagnosis not present

## 2020-06-27 DIAGNOSIS — Z66 Do not resuscitate: Secondary | ICD-10-CM | POA: Diagnosis not present

## 2020-06-27 DIAGNOSIS — R4020 Unspecified coma: Secondary | ICD-10-CM | POA: Diagnosis not present

## 2020-06-27 NOTE — Care Plan (Signed)
Hospitalist Transfer Accept Note  85 yo F with hx Parkinson's, CAD, HTN, no prior seizure, who presented with recurrent seizures, onset this morning.  GTC seizures witnessed by EMS x4 aborted with Ativan.    In ER, post-ictal.  Loaded with Keppra. Non-contrasted CT head shows "interval growth" of 2cm L frontal lobe mass.  No further seizures in ER.    To 3W, med surg status.  Will need to page Neurology on arrival.

## 2020-06-28 DIAGNOSIS — D496 Neoplasm of unspecified behavior of brain: Secondary | ICD-10-CM | POA: Diagnosis not present

## 2020-06-28 DIAGNOSIS — R569 Unspecified convulsions: Secondary | ICD-10-CM | POA: Diagnosis not present

## 2020-06-28 DIAGNOSIS — G9389 Other specified disorders of brain: Secondary | ICD-10-CM | POA: Diagnosis not present

## 2020-06-28 DIAGNOSIS — G319 Degenerative disease of nervous system, unspecified: Secondary | ICD-10-CM | POA: Diagnosis not present

## 2020-06-29 DIAGNOSIS — Z7401 Bed confinement status: Secondary | ICD-10-CM | POA: Diagnosis not present

## 2020-06-29 DIAGNOSIS — Z743 Need for continuous supervision: Secondary | ICD-10-CM | POA: Diagnosis not present

## 2020-06-29 DIAGNOSIS — R404 Transient alteration of awareness: Secondary | ICD-10-CM | POA: Diagnosis not present

## 2020-07-09 DIAGNOSIS — R2689 Other abnormalities of gait and mobility: Secondary | ICD-10-CM | POA: Diagnosis not present

## 2020-07-09 DIAGNOSIS — M6281 Muscle weakness (generalized): Secondary | ICD-10-CM | POA: Diagnosis not present

## 2020-07-16 ENCOUNTER — Telehealth: Payer: Self-pay | Admitting: Neurology

## 2020-07-16 NOTE — Telephone Encounter (Signed)
Spoke with daughter and expressed our condolences.  She expressed appreciation for care received.

## 2020-07-16 NOTE — Telephone Encounter (Signed)
Daughter returning your call,

## 2020-07-16 NOTE — Telephone Encounter (Signed)
Patient's daughter Mickey Farber called in wanting to speak with Dr. Carles Collet about her mother.

## 2020-08-03 DEATH — deceased

## 2020-09-13 ENCOUNTER — Ambulatory Visit: Payer: Medicare HMO | Admitting: Neurology

## 2020-09-17 ENCOUNTER — Ambulatory Visit: Payer: Medicare HMO | Admitting: Neurology

## 2022-04-29 IMAGING — DX DG HIP (WITH OR WITHOUT PELVIS) 3-4V BILAT
4 series · 4 of 4 positions shown · non-contrast
Comparison: None.

CLINICAL DATA: Weakness, fell, right hip pain

EXAM:
RIGHT FEMUR 2 VIEWS; DG HIP (WITH OR WITHOUT PELVIS) 3-4V BILAT

[pelvis ap (1 of 2)]
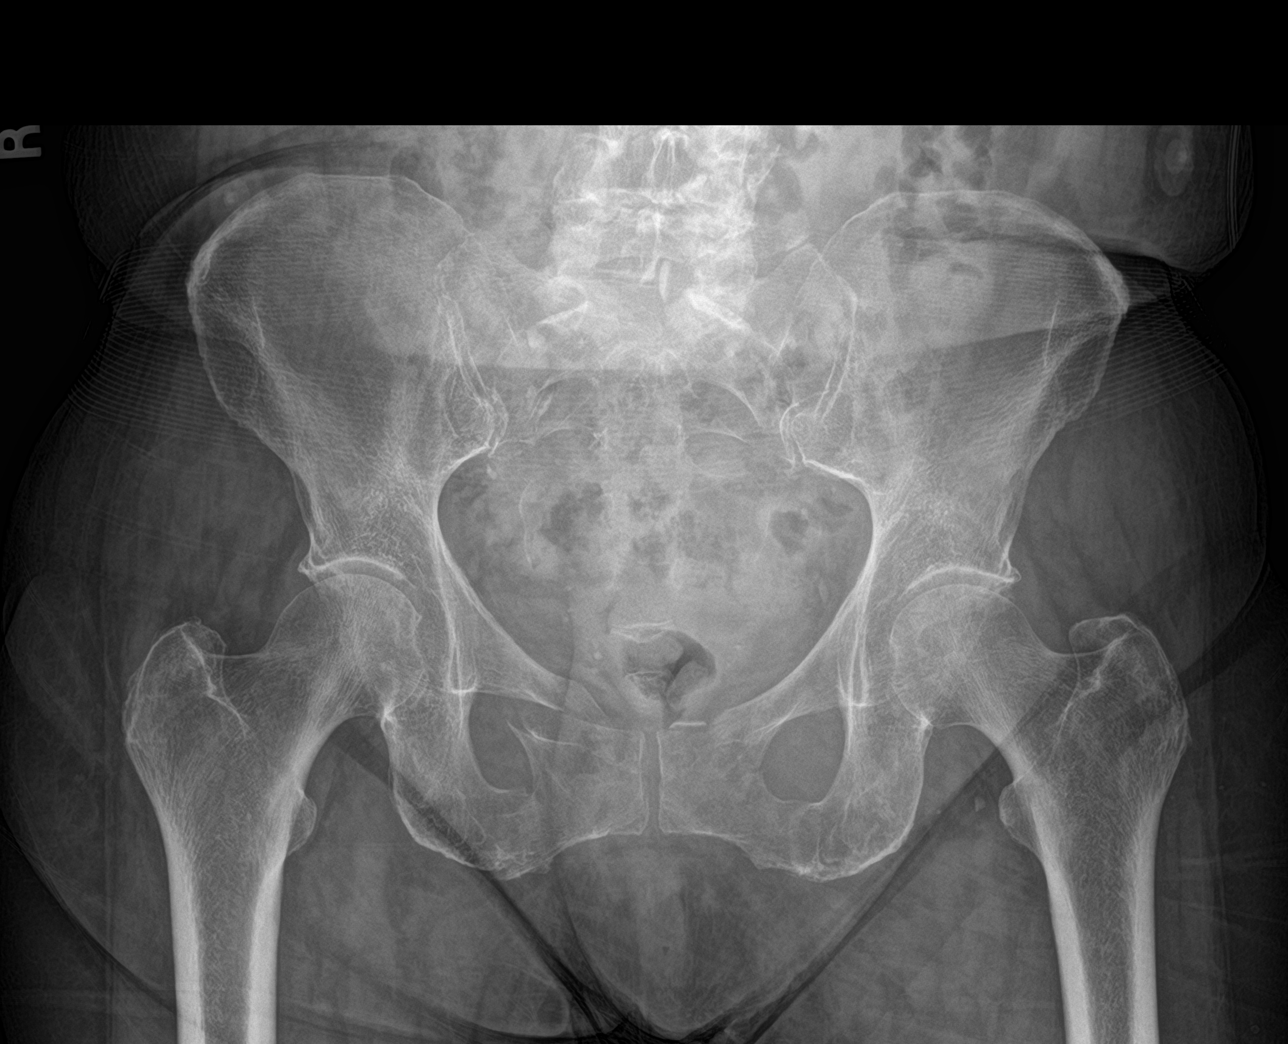

[hip ap (1 of 2)]
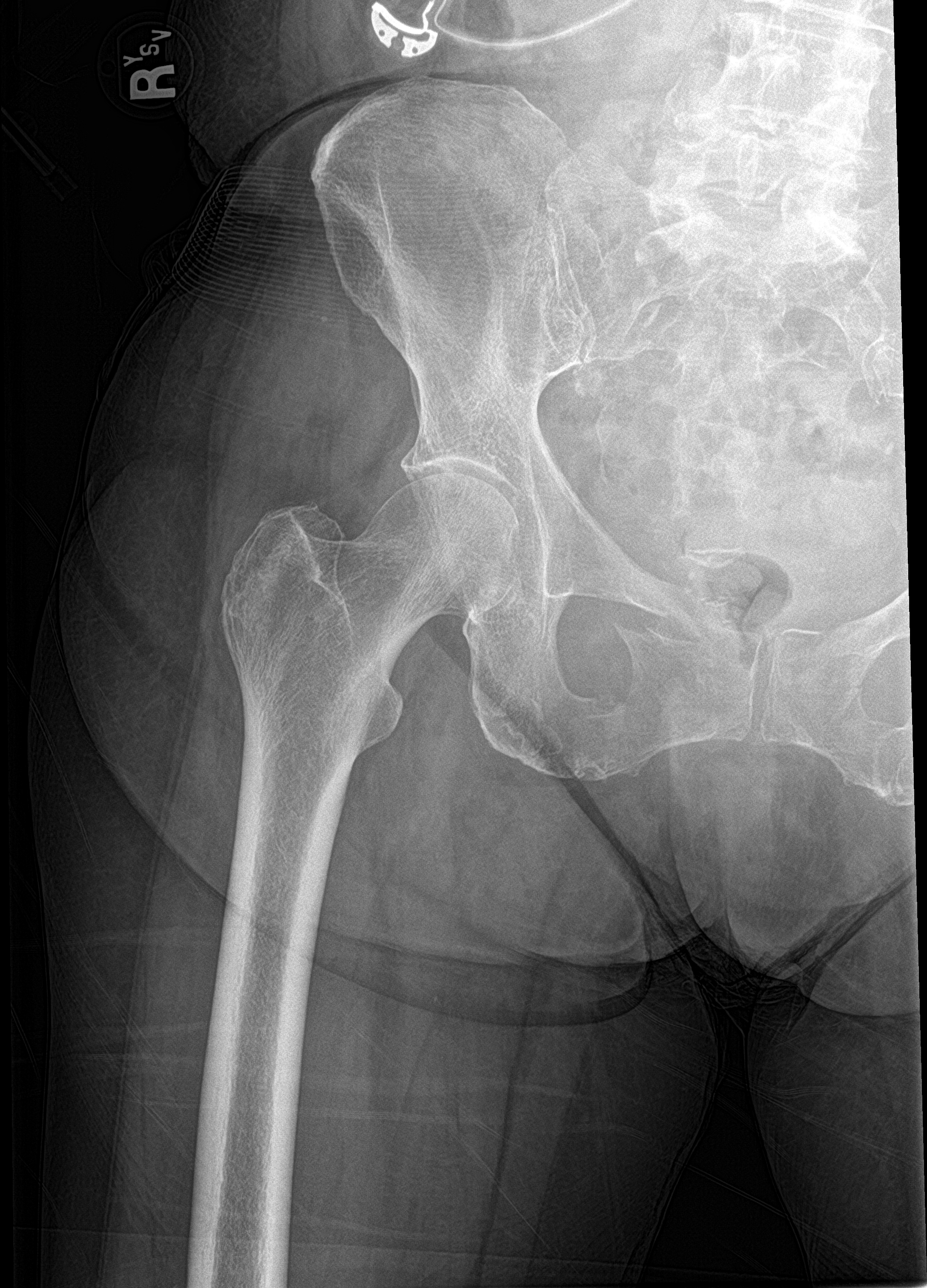

[hip ap (2 of 2)]
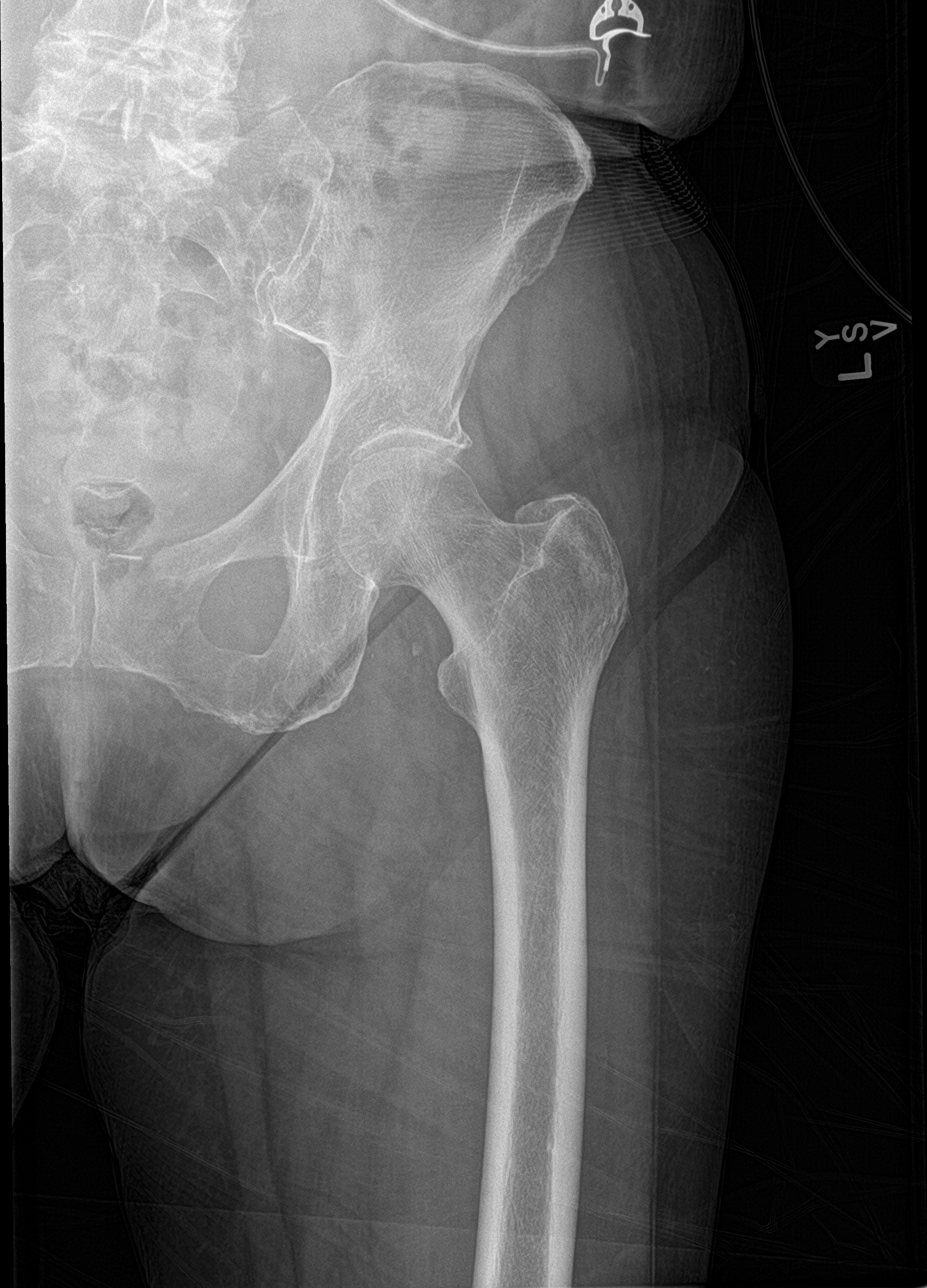

[pelvis ap (2 of 2)]
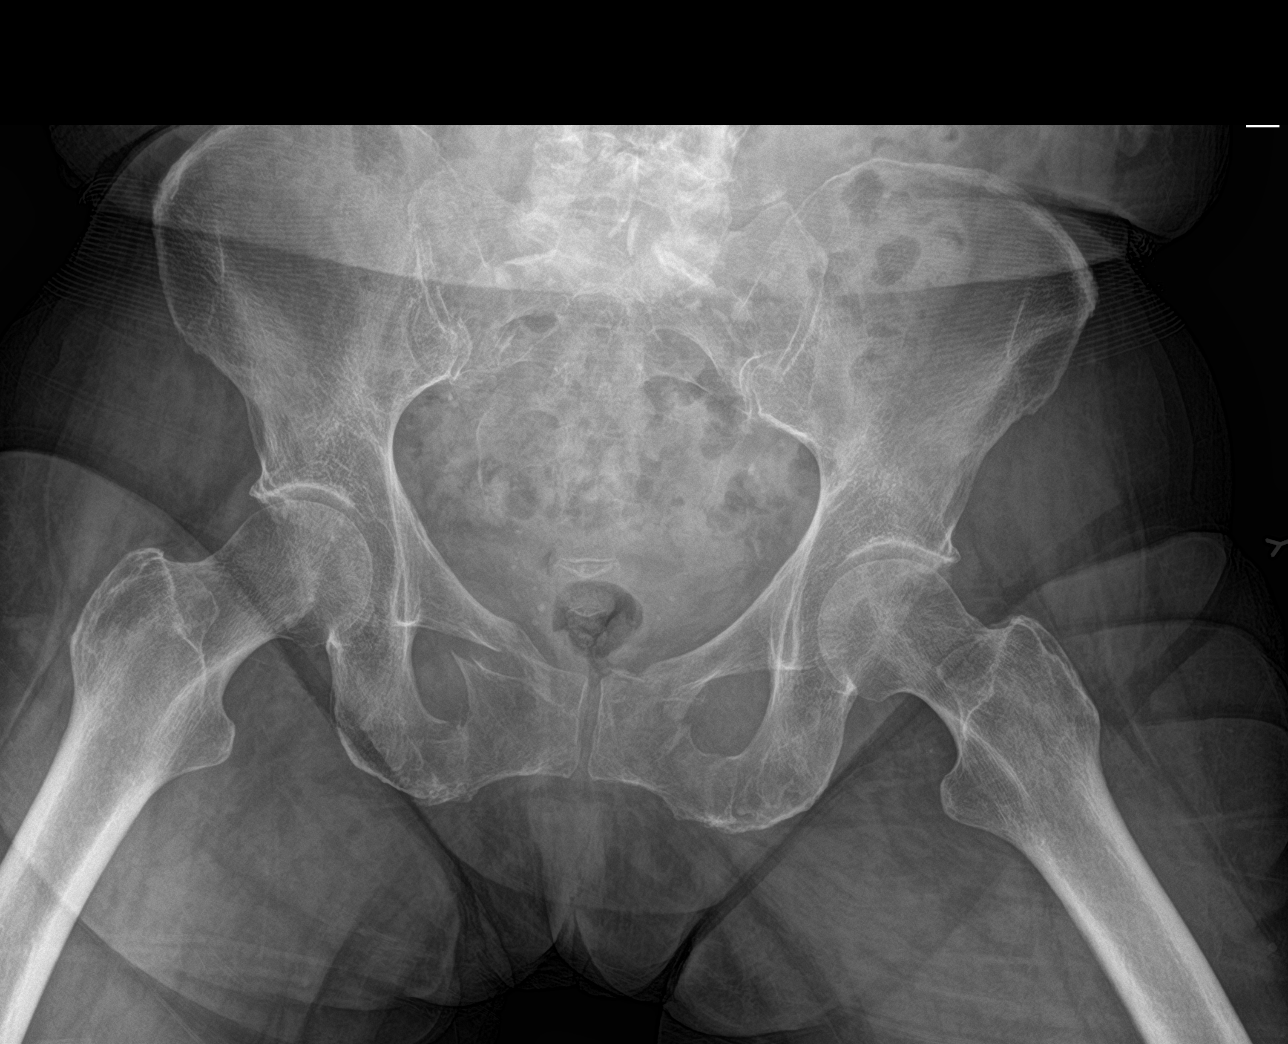

[4 of 4 positions shown; findings below may reference images not displayed]

FINDINGS: Bilateral hips: Frontal view of the pelvis as well as frontal and
frogleg lateral views of the bilateral hips are obtained. There are
comminuted minimally displaced fractures through the bilateral
superior and inferior pubic rami, with mild displacement on the
right. No other acute bony abnormalities. Hips are well aligned.
Mild symmetrical joint space narrowing. Prominent lower lumbar
spondylosis.

Right femur: Frontal and lateral views demonstrate an unremarkable
right femur. Right hip and knee are well aligned. Soft tissues are
unremarkable.
IMPRESSION: 1. Bilateral superior and inferior pubic rami fractures, with mild
displacement on the right.
2. No other acute bony abnormalities.
3. Lower lumbar spondylosis.
4. Mild symmetrical hip osteoarthritis.

## 2022-04-30 IMAGING — US US CAROTID DUPLEX BILAT
1 series · 13 of 24 positions shown · non-contrast
Comparison: None.

CLINICAL DATA: Syncope with collapse, hyperlipidemia

EXAM:
BILATERAL CAROTID DUPLEX ULTRASOUND
TECHNIQUE: Gray scale imaging, color Doppler and duplex ultrasound were
performed of bilateral carotid and vertebral arteries in the neck.

[Series 1: us carotid bilateral · 13 of 71 slices shown]
[im 1/71]
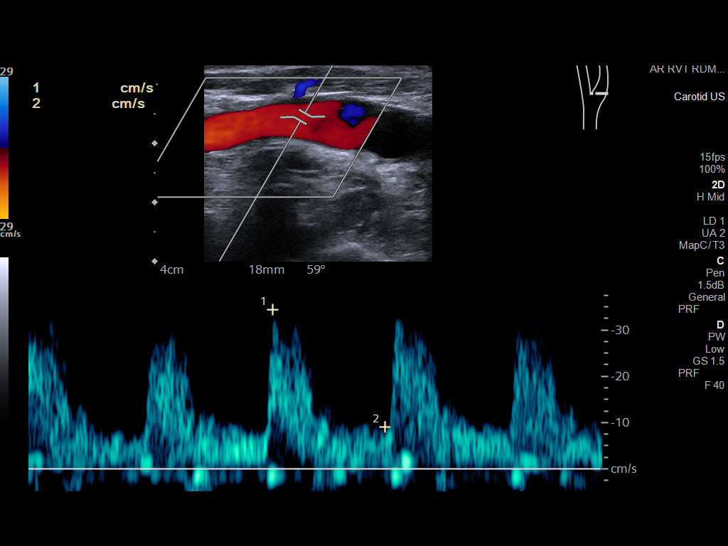
[im 7/71]
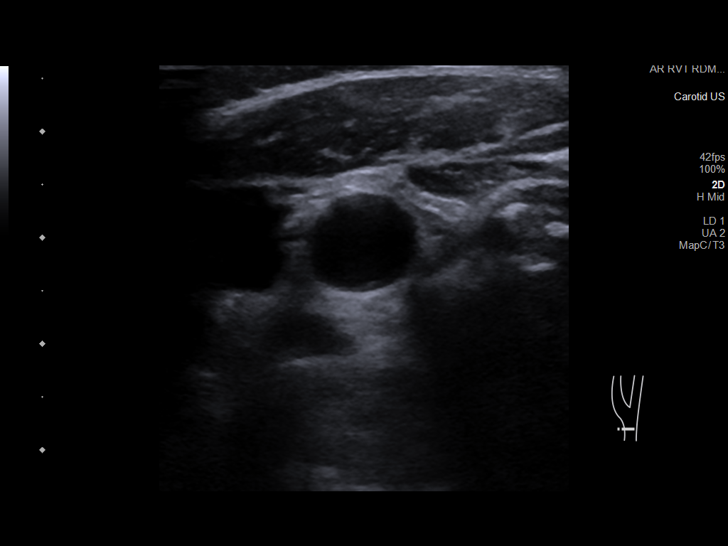
[im 13/71]
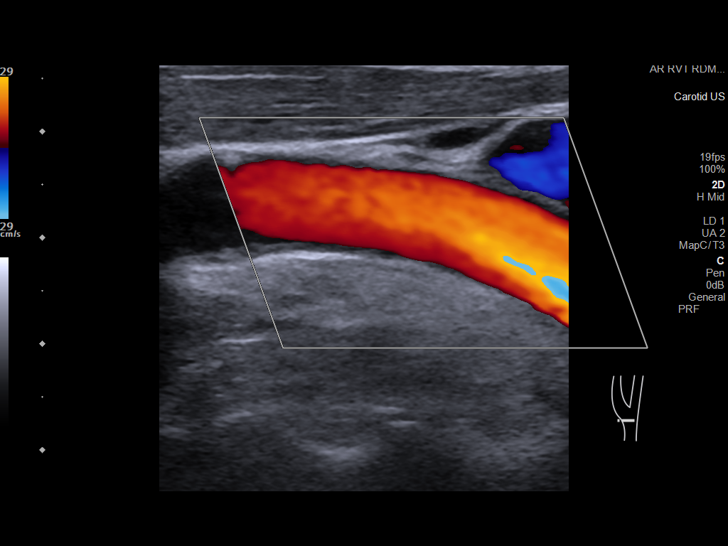
[im 19/71]
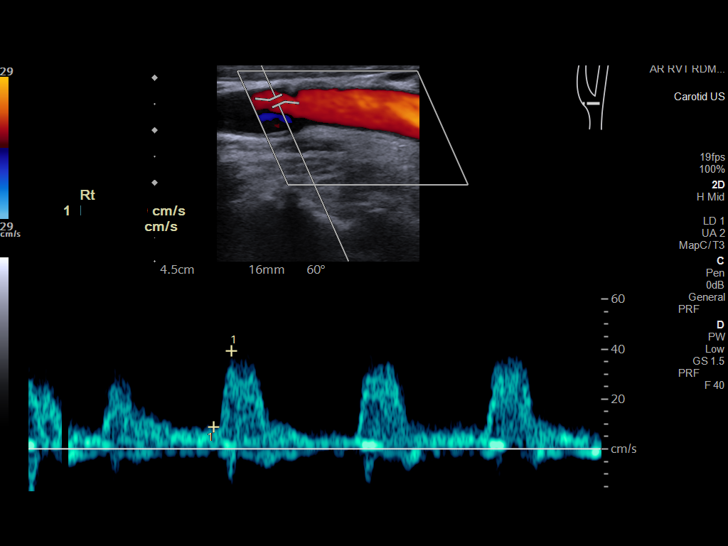
[im 25/71]
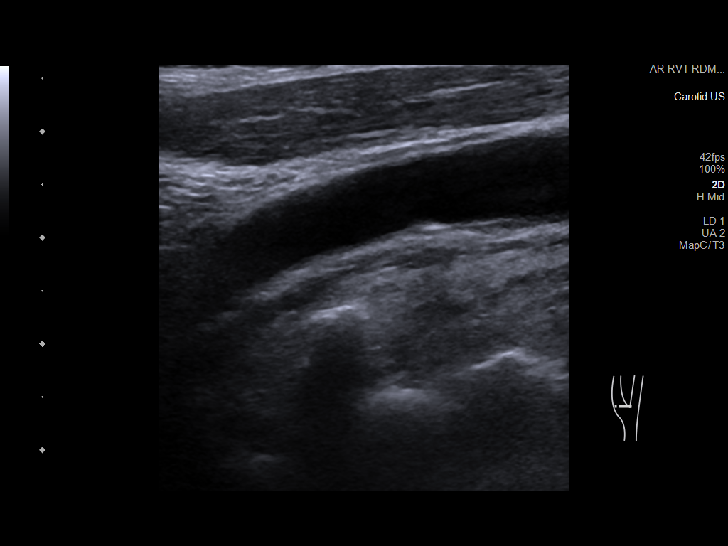
[im 31/71]
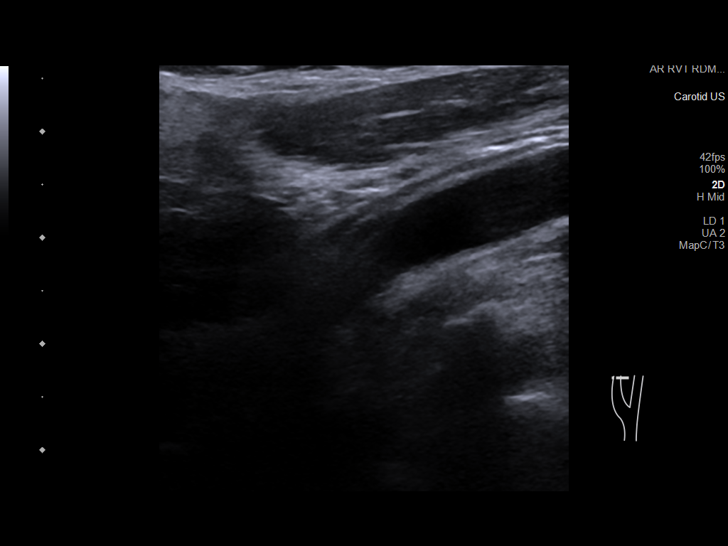
[im 37/71]
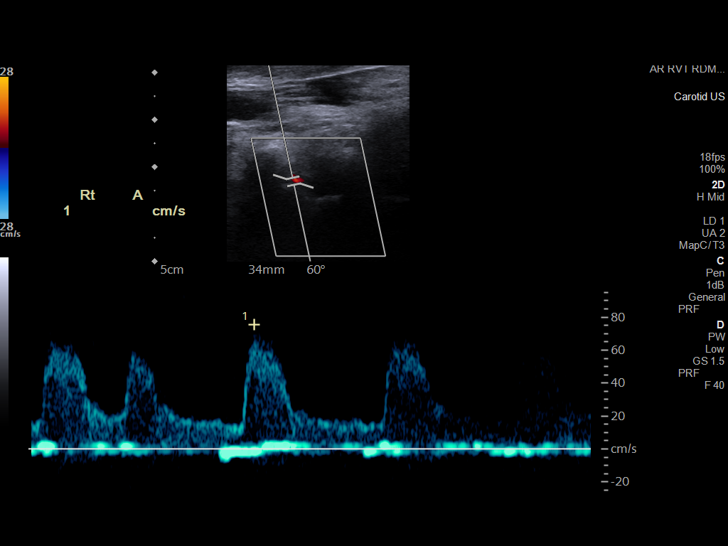
[im 40/71]
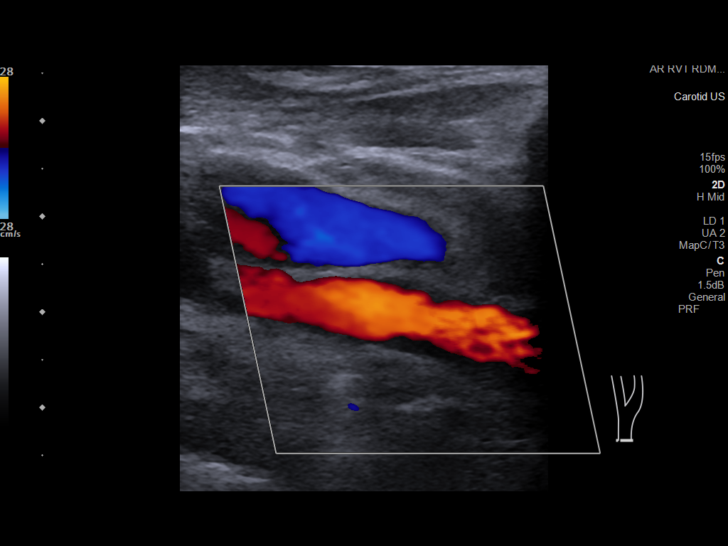
[im 46/71]
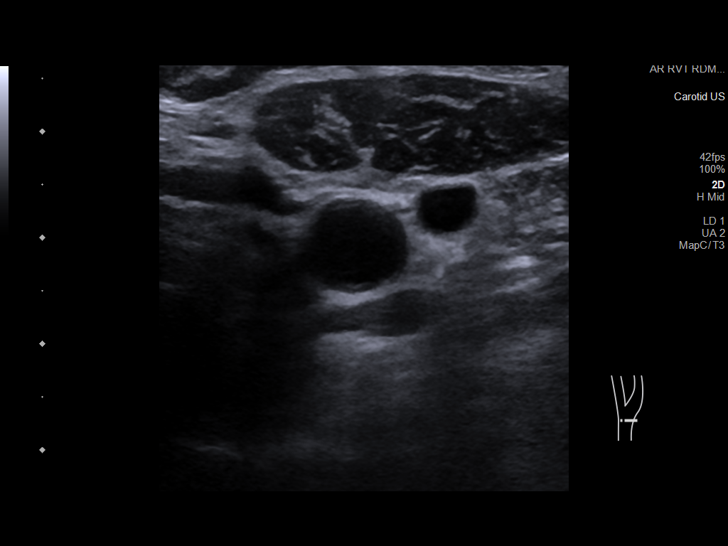
[im 52/71]
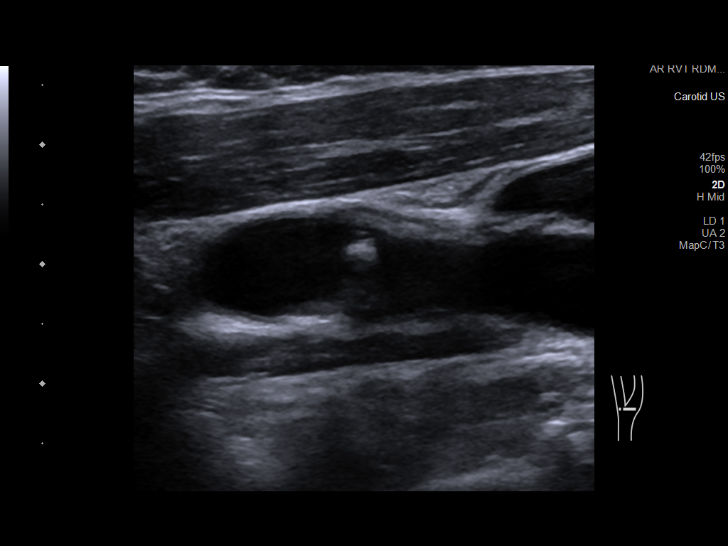
[im 58/71]
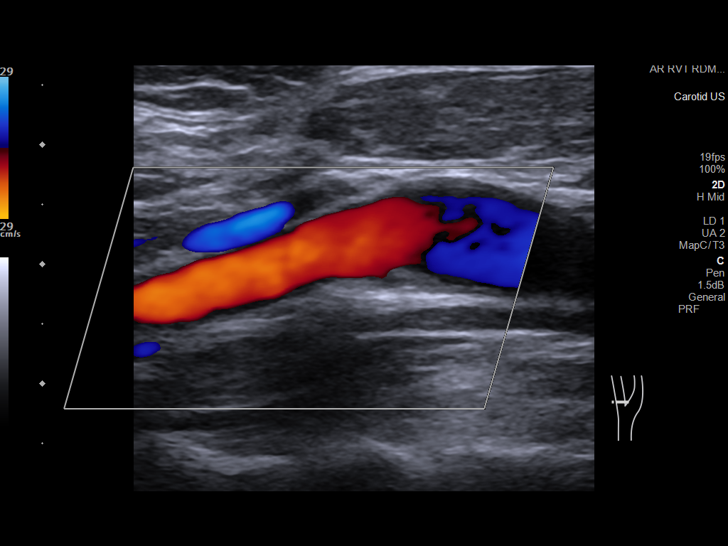
[im 64/71]
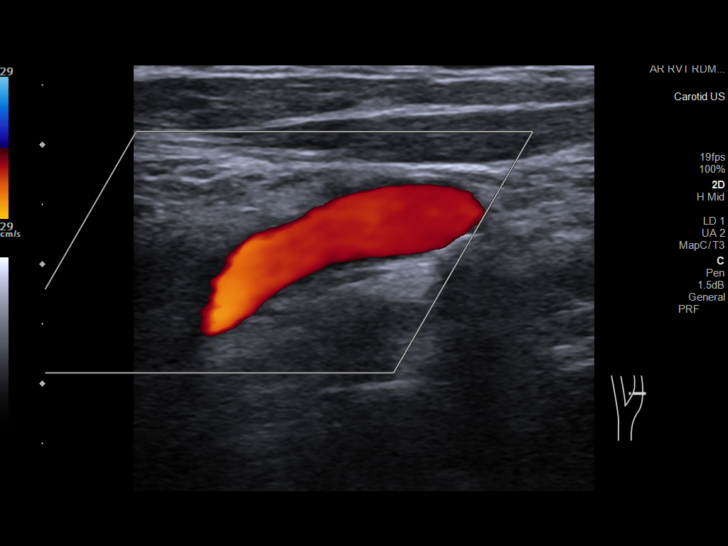
[im 71/71]
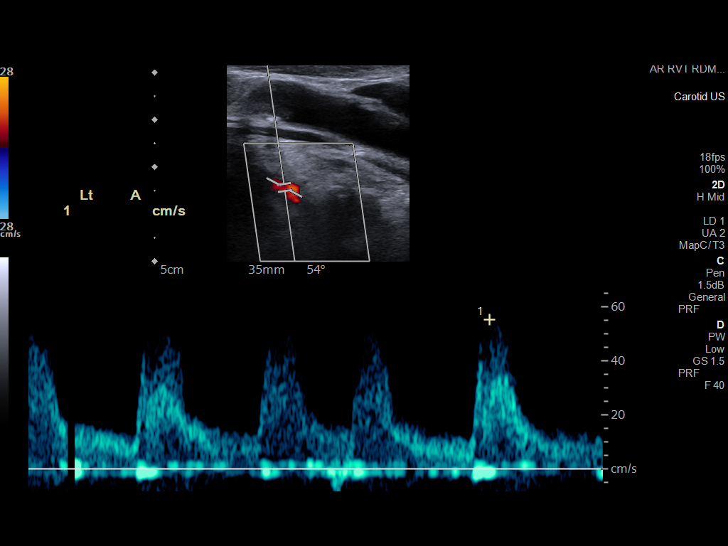

[13 of 24 positions shown; findings below may reference images not displayed]

FINDINGS: Criteria: Quantification of carotid stenosis is based on velocity
parameters that correlate the residual internal carotid diameter
with NASCET-based stenosis levels, using the diameter of the distal
internal carotid lumen as the denominator for stenosis measurement.

The following velocity measurements were obtained:

RIGHT

ICA: 48/13 cm/sec

CCA: 48/11 cm/sec

SYSTOLIC ICA/CCA RATIO:

ECA: 72 cm/sec

LEFT

ICA: 38/8 cm/sec

CCA: 65/11 cm/sec

SYSTOLIC ICA/CCA RATIO:

ECA: 59 cm/sec

RIGHT CAROTID ARTERY: Minor echogenic shadowing plaque formation. No
hemodynamically significant right ICA stenosis, velocity elevation,
or turbulent flow. Degree of narrowing less than 50%.

RIGHT VERTEBRAL ARTERY:  Normal antegrade flow

LEFT CAROTID ARTERY: Similar scattered minor echogenic plaque
formation. No hemodynamically significant left ICA stenosis,
velocity elevation, or turbulent flow.

LEFT VERTEBRAL ARTERY:  Normal antegrade flow
IMPRESSION: Minor carotid atherosclerosis. No hemodynamically significant ICA
stenosis. Degree of narrowing less than 50% bilaterally by
ultrasound criteria.

Patent antegrade vertebral flow bilaterally
